# Patient Record
Sex: Male | Born: 1990 | Race: White | Hispanic: No | Marital: Married | Smoking: Former smoker
Health system: Southern US, Community
[De-identification: ages and names within clinical notes are randomized; demographics above are authoritative.]

## PROBLEM LIST (undated history)

## (undated) DIAGNOSIS — I493 Ventricular premature depolarization: Secondary | ICD-10-CM

## (undated) DIAGNOSIS — R55 Syncope and collapse: Secondary | ICD-10-CM

## (undated) DIAGNOSIS — K259 Gastric ulcer, unspecified as acute or chronic, without hemorrhage or perforation: Secondary | ICD-10-CM

## (undated) HISTORY — DX: Syncope and collapse: R55

## (undated) HISTORY — DX: Ventricular premature depolarization: I49.3

---

## 2014-11-10 ENCOUNTER — Emergency Department (HOSPITAL_BASED_OUTPATIENT_CLINIC_OR_DEPARTMENT_OTHER)
Admission: EM | Admit: 2014-11-10 | Discharge: 2014-11-10 | Disposition: A | Discharge status: Home / Self Care | Payer: Self-pay | Attending: Emergency Medicine | Admitting: Emergency Medicine

## 2014-11-10 ENCOUNTER — Emergency Department (HOSPITAL_BASED_OUTPATIENT_CLINIC_OR_DEPARTMENT_OTHER): Payer: Self-pay

## 2014-11-10 ENCOUNTER — Encounter (HOSPITAL_BASED_OUTPATIENT_CLINIC_OR_DEPARTMENT_OTHER): Payer: Self-pay | Admitting: *Deleted

## 2014-11-10 DIAGNOSIS — R109 Unspecified abdominal pain: Secondary | ICD-10-CM

## 2014-11-10 DIAGNOSIS — Z87891 Personal history of nicotine dependence: Secondary | ICD-10-CM | POA: Insufficient documentation

## 2014-11-10 DIAGNOSIS — Z880 Allergy status to penicillin: Secondary | ICD-10-CM | POA: Insufficient documentation

## 2014-11-10 DIAGNOSIS — R1084 Generalized abdominal pain: Secondary | ICD-10-CM | POA: Insufficient documentation

## 2014-11-10 DIAGNOSIS — Z8719 Personal history of other diseases of the digestive system: Secondary | ICD-10-CM | POA: Insufficient documentation

## 2014-11-10 HISTORY — DX: Gastric ulcer, unspecified as acute or chronic, without hemorrhage or perforation: K25.9

## 2014-11-10 LAB — URINALYSIS, ROUTINE W REFLEX MICROSCOPIC
Bilirubin Urine: NEGATIVE
GLUCOSE, UA: NEGATIVE mg/dL
HGB URINE DIPSTICK: NEGATIVE
KETONES UR: NEGATIVE mg/dL
Leukocytes, UA: NEGATIVE
Nitrite: NEGATIVE
PROTEIN: NEGATIVE mg/dL
Specific Gravity, Urine: 1.017 (ref 1.005–1.030)
UROBILINOGEN UA: 0.2 mg/dL (ref 0.0–1.0)
pH: 8 (ref 5.0–8.0)

## 2014-11-10 LAB — CBC WITH DIFFERENTIAL/PLATELET
BASOS PCT: 1 % (ref 0–1)
Basophils Absolute: 0 10*3/uL (ref 0.0–0.1)
EOS ABS: 0.3 10*3/uL (ref 0.0–0.7)
EOS PCT: 5 % (ref 0–5)
HCT: 47.4 % (ref 39.0–52.0)
HEMOGLOBIN: 16.4 g/dL (ref 13.0–17.0)
LYMPHS PCT: 35 % (ref 12–46)
Lymphs Abs: 2.1 10*3/uL (ref 0.7–4.0)
MCH: 31.4 pg (ref 26.0–34.0)
MCHC: 34.6 g/dL (ref 30.0–36.0)
MCV: 90.8 fL (ref 78.0–100.0)
MONO ABS: 0.4 10*3/uL (ref 0.1–1.0)
Monocytes Relative: 7 % (ref 3–12)
NEUTROS ABS: 3.2 10*3/uL (ref 1.7–7.7)
Neutrophils Relative %: 52 % (ref 43–77)
PLATELETS: 196 10*3/uL (ref 150–400)
RBC: 5.22 MIL/uL (ref 4.22–5.81)
RDW: 12.1 % (ref 11.5–15.5)
WBC: 6.1 10*3/uL (ref 4.0–10.5)

## 2014-11-10 LAB — COMPREHENSIVE METABOLIC PANEL
ALBUMIN: 4.4 g/dL (ref 3.5–5.0)
ALT: 26 U/L (ref 17–63)
AST: 19 U/L (ref 15–41)
Alkaline Phosphatase: 63 U/L (ref 38–126)
Anion gap: 9 (ref 5–15)
BUN: 10 mg/dL (ref 6–20)
CHLORIDE: 104 mmol/L (ref 101–111)
CO2: 27 mmol/L (ref 22–32)
Calcium: 9.4 mg/dL (ref 8.9–10.3)
Creatinine, Ser: 0.85 mg/dL (ref 0.61–1.24)
GFR calc Af Amer: >60 mL/min (ref >60)
GLUCOSE: 90 mg/dL (ref 65–99)
POTASSIUM: 4.1 mmol/L (ref 3.5–5.1)
SODIUM: 140 mmol/L (ref 135–145)
Total Bilirubin: 1.3 mg/dL — ABNORMAL HIGH (ref 0.3–1.2)
Total Protein: 7.1 g/dL (ref 6.5–8.1)

## 2014-11-10 LAB — LIPASE, BLOOD: LIPASE: 17 U/L — AB (ref 22–51)

## 2014-11-10 MED ORDER — POLYETHYLENE GLYCOL 3350 17 GM/SCOOP PO POWD: 17.0000 g | Freq: Two times a day (BID) | ORAL | Status: DC

## 2014-11-10 NOTE — ED Provider Notes (Signed)
CSN: [id number]     Arrival date & time 11/10/14  1124 History   First MD Initiated Contact with Patient 11/10/14 1226     Chief Complaint  Patient presents with  . Abdominal Pain     (Consider location/radiation/quality/duration/timing/severity/associated sxs/prior Treatment) HPI Jeffrey Ramos is a 24 y.o. male reports a history of gastric ulcers, comes in for evaluation of abdominal pain. Patient states he has not had a bowel movement in 9 days. This is not normal for him. He reports usually he will have a bowel movement every 2-4 days. He has tried milk of magnesia without relief of his symptoms. He reports constant abdominal pain in his lower abdomen now that he likens to "being punched". He denies any urge to defecate. Denies fevers, chills, nausea or vomiting, urinary symptoms, rectal pain, dark or bloody stools. Reports that he has a fairly bland diet and no longer eats spicy foods or red meats per the instructions of his doctor in [place].  Past Medical History  Diagnosis Date  . Gastric peptic ulcer    History reviewed. No pertinent past surgical history. No family history on file. Social History  Substance Use Topics  . Smoking status: Former Games developer  . Smokeless tobacco: None  . Alcohol Use: Yes     Comment: occassional     Review of Systems A 10 point review of systems was completed and was negative except for pertinent positives and negatives as mentioned in the history of present illness     Allergies  Penicillins  Home Medications   Prior to Admission medications   Medication Sig Start Date End Date Taking? Authorizing Provider  polyethylene glycol powder (GLYCOLAX/MIRALAX) powder Take 17 g by mouth 2 (two) times daily. Until daily soft stools  OTC 11/10/14   Joycie Peek, PA-C   BP 123/68 mmHg  Pulse 51  Temp(Src) 98.2 F (36.8 C) (Oral)  Resp 16  Ht 6\' 3"  (1.905 m)  Wt 200 lb (90.719 kg)  BMI 25.00 kg/m2  SpO2 100% Physical Exam  Constitutional: He  is oriented to person, place, and time. He appears well-developed and well-nourished.  HENT:  Head: Normocephalic and atraumatic.  Mouth/Throat: Oropharynx is clear and moist.  Eyes: Conjunctivae are normal. Pupils are equal, round, and reactive to light. Right eye exhibits no discharge. Left eye exhibits no discharge. No scleral icterus.  Neck: Neck supple.  Cardiovascular: Normal rate, regular rhythm and normal heart sounds.   Pulmonary/Chest: Effort normal and breath sounds normal. No respiratory distress. He has no wheezes. He has no rales.  Abdominal: Soft.  Tenderness diffusely throughout her lower abdomen. Abdomen remains soft, nondistended. No lesions or deformities or masses noted. Bowel sounds active  Musculoskeletal: He exhibits no tenderness.  Neurological: He is alert and oriented to person, place, and time.  Cranial Nerves II-XII grossly intact  Skin: Skin is warm and dry. No rash noted.  Psychiatric: He has a normal mood and affect.  Nursing note and vitals reviewed.   ED Course  Procedures (including critical care time) Labs Review Labs Reviewed  COMPREHENSIVE METABOLIC PANEL - Abnormal; Notable for the following:    Total Bilirubin 1.3 (*)    All other components within normal limits  LIPASE, BLOOD - Abnormal; Notable for the following:    Lipase 17 (*)    All other components within normal limits  CBC WITH DIFFERENTIAL/PLATELET  URINALYSIS, ROUTINE W REFLEX MICROSCOPIC (NOT AT [facility])    Imaging Review Dg Abd Acute W/chest  11/10/2014  CLINICAL DATA:  Abdominal pain and constipation.  EXAM: DG ABDOMEN ACUTE W/ 1V CHEST  COMPARISON:  None.  FINDINGS: Generous appearance of the heart with prominent medial right lung markings, suspect pectus excavatum, but cannot confirm in the frontal projection. There is no edema, consolidation, effusion, or pneumothorax.  Normal bowel gas pattern with no evidence of pneumatosis or pneumoperitoneum. No concerning intra-abdominal mass  effect or calcification. No abnormal stool retention.  IMPRESSION: Negative abdominal radiographs.  No acute cardiopulmonary disease.   Electronically Signed   By: Marnee Spring M.D.   On: 11/10/2014 13:38   I have personally reviewed and evaluated these images and lab results as part of my medical decision-making.   EKG Interpretation None     Meds given in ED:  Medications - No data to display  Discharge Medication List as of 11/10/2014  2:48 PM    START taking these medications   Details  polyethylene glycol powder (GLYCOLAX/MIRALAX) powder Take 17 g by mouth 2 (two) times daily. Until daily soft stools  OTC, Starting 11/10/2014, Until Discontinued, Print       Filed Vitals:   11/10/14 1141 11/10/14 1429  BP: 126/81 123/68  Pulse: 62 51  Temp: 98.2 F (36.8 C)   TempSrc: Oral   Resp: 16 16  Height:  (1.905 m)   Weight: 200 lb (90.719 kg)   SpO2: 100% 100%    MDM  Vitals stable - WNL -afebrile Pt resting comfortably in ED. PE--diffuse abdominal discomfort palpation in lower abdomen. No abdominal distention. No peritoneal signs or other evidence of surgical abdomen. Labwork-labs Baseline noncontributory. Imaging-plain films of abdomen and chest are negative. No obvious stool burden or other abnormal pathology.  DDX--patient here with abdominal discomfort likely secondary to constipation. Will DC with MiraLAX.  Low suspicion for appendicitis or other infectious process.  No evidence of other acute or emergent intra-abdominal pathology.  I discussed all relevant lab findings and imaging results with pt and they verbalized understanding. Discussed f/u with PCP within 48 hrs and return precautions, pt very amenable to plan. Prior to patient discharge, I discussed and reviewed this case with Dr.Belfi   Final diagnoses:  Abdominal discomfort       Joycie Peek, PA-C 11/10/14 1536  Rolan Bucco, MD 11/10/14 1546

## 2014-11-10 NOTE — Discharge Instructions (Signed)
You were evaluated in the ED today for abdominal discomfort. There does not appear to be an emergent cause for your symptoms at this time. Your exam, labs and x-rays are all reassuring. Please take your MiraLAX as directed to facilitate bowel movements. Follow-up with your doctors as needed. Return to ED for new or worsening symptoms.  Abdominal Pain Many things can cause abdominal pain. Usually, abdominal pain is not caused by a disease and will improve without treatment. It can often be observed and treated at home. Your health care provider will do a physical exam and possibly order blood tests and X-rays to help determine the seriousness of your pain. However, in many cases, more time must pass before a clear cause of the pain can be found. Before that point, your health care provider may not know if you need more testing or further treatment. HOME CARE INSTRUCTIONS  Monitor your abdominal pain for any changes. The following actions may help to alleviate any discomfort you are experiencing:  Only take over-the-counter or prescription medicines as directed by your health care provider.  Do not take laxatives unless directed to do so by your health care provider.  Try a clear liquid diet (broth, tea, or water) as directed by your health care provider. Slowly move to a bland diet as tolerated. SEEK MEDICAL CARE IF:  You have unexplained abdominal pain.  You have abdominal pain associated with nausea or diarrhea.  You have pain when you urinate or have a bowel movement.  You experience abdominal pain that wakes you in the night.  You have abdominal pain that is worsened or improved by eating food.  You have abdominal pain that is worsened with eating fatty foods.  You have a fever. SEEK IMMEDIATE MEDICAL CARE IF:   Your pain does not go away within 2 hours.  You keep throwing up (vomiting).  Your pain is felt only in portions of the abdomen, such as the right side or the left lower  portion of the abdomen.  You pass bloody or black tarry stools. MAKE SURE YOU:  Understand these instructions.   Will watch your condition.   Will get help right away if you are not doing well or get worse.  Document Released: 12/08/2004 Document Revised: 03/05/2013 Document Reviewed: 11/07/2012 [facility] Patient Information 2015 [place], [place]. This information is not intended to replace advice given to you by your health care provider. Make sure you discuss any questions you have with your health care provider.

## 2014-11-10 NOTE — ED Notes (Signed)
Abdominal pain. States he has not had a BM in 9 days. He took MOM with no relief but is having cramps.

## 2014-11-10 NOTE — ED Notes (Signed)
Patient transported to X-ray ambulatory with tech. 

## 2014-12-06 ENCOUNTER — Emergency Department (HOSPITAL_BASED_OUTPATIENT_CLINIC_OR_DEPARTMENT_OTHER)
Admission: EM | Admit: 2014-12-06 | Discharge: 2014-12-06 | Disposition: A | Discharge status: Home / Self Care | Payer: Self-pay | Attending: Emergency Medicine | Admitting: Emergency Medicine

## 2014-12-06 ENCOUNTER — Encounter (HOSPITAL_BASED_OUTPATIENT_CLINIC_OR_DEPARTMENT_OTHER): Payer: Self-pay | Admitting: *Deleted

## 2014-12-06 DIAGNOSIS — Z880 Allergy status to penicillin: Secondary | ICD-10-CM | POA: Insufficient documentation

## 2014-12-06 DIAGNOSIS — Z8711 Personal history of peptic ulcer disease: Secondary | ICD-10-CM | POA: Insufficient documentation

## 2014-12-06 DIAGNOSIS — Z87891 Personal history of nicotine dependence: Secondary | ICD-10-CM | POA: Insufficient documentation

## 2014-12-06 DIAGNOSIS — L03115 Cellulitis of right lower limb: Secondary | ICD-10-CM | POA: Insufficient documentation

## 2014-12-06 MED ORDER — CLINDAMYCIN HCL 300 MG PO CAPS: 300.0000 mg | ORAL_CAPSULE | Freq: Four times a day (QID) | ORAL | Status: DC

## 2014-12-06 MED ORDER — LIDOCAINE-EPINEPHRINE 1 %-1:100000 IJ SOLN
INTRAMUSCULAR | Status: AC
  Administered 2014-12-06: 11:00:00
  Filled 2014-12-06: qty 1

## 2014-12-06 NOTE — ED Notes (Signed)
Rt knee pain, states injured rt knee on nail, presents with increased pain at rt knee, has two red areas, that appeared slightly swollen. Having throbbing type pain, states having HA

## 2014-12-06 NOTE — Discharge Instructions (Signed)

## 2014-12-06 NOTE — ED Notes (Signed)
MD at bedside. 

## 2014-12-06 NOTE — ED Notes (Signed)
Areas marked with skin marker, pt states having yellow thick drainage from areas

## 2014-12-06 NOTE — ED Provider Notes (Signed)
CSN: [id number]     Arrival date & time 12/06/14  1012 History   First MD Initiated Contact with Patient 12/06/14 1032     Chief Complaint  Patient presents with  . Knee Pain     (Consider location/radiation/quality/duration/timing/severity/associated sxs/prior Treatment) Patient is a 24 y.o. male presenting with knee pain.  Knee Pain Location:  Knee Time since incident:  3 days Injury: no   Knee location:  R knee Pain details:    Quality:  Aching   Radiates to:  Does not radiate   Severity:  Moderate   Onset quality:  Gradual   Timing:  Constant   Progression:  Unchanged Chronicity:  New Prior injury to area:  No Relieved by:  Nothing Exacerbated by: pressure on sites. Ineffective treatments:  None tried Associated symptoms: no decreased ROM, no fever, no numbness, no stiffness and no swelling     Past Medical History  Diagnosis Date  . Gastric peptic ulcer    History reviewed. No pertinent past surgical history. History reviewed. No pertinent family history. Social History  Substance Use Topics  . Smoking status: Former Games developer  . Smokeless tobacco: None  . Alcohol Use: Yes     Comment: occassional     Review of Systems  Constitutional: Negative for fever.  Musculoskeletal: Negative for stiffness.  All other systems reviewed and are negative.     Allergies  Doxycycline and Penicillins  Home Medications   Prior to Admission medications   Medication Sig Start Date End Date Taking? Authorizing Provider  clindamycin (CLEOCIN) 300 MG capsule Take 1 capsule (300 mg total) by mouth 4 (four) times daily. X 7 days 12/06/14   Mirian Mo, MD  polyethylene glycol powder (GLYCOLAX/MIRALAX) powder Take 17 g by mouth 2 (two) times daily. Until daily soft stools  OTC 11/10/14   Joycie Peek, PA-C   BP 133/81 mmHg  Pulse 80  Temp(Src) 98.3 F (36.8 C) (Oral)  Resp 18  Ht  (1.905 m)  Wt 200 lb (90.719 kg)  BMI 25.00 kg/m2  SpO2 100% Physical Exam   Constitutional: He is oriented to person, place, and time. He appears well-developed and well-nourished.  HENT:  Head: Normocephalic and atraumatic.  Eyes: Conjunctivae and EOM are normal.  Neck: Normal range of motion. Neck supple.  Cardiovascular: Normal rate, regular rhythm and normal heart sounds.   Pulmonary/Chest: Effort normal and breath sounds normal. No respiratory distress.  Abdominal: He exhibits no distension. There is no tenderness. There is no rebound and no guarding.  Musculoskeletal: Normal range of motion.       Right knee: He exhibits normal range of motion, no swelling and no effusion. Tenderness (over skin) found.  Neurological: He is alert and oriented to person, place, and time.  Skin: Skin is warm and dry.  2 areas of erythema around inflamed hair follicles, R lateral supra and inferior patellar  Vitals reviewed.   ED Course  INCISION AND DRAINAGE Date/Time: 12/06/2014 11:26 AM Performed by: Mirian Mo Authorized by: Mirian Mo Consent: Verbal consent obtained. Type: abscess Body area: lower extremity (R knee) Anesthesia: local infiltration Local anesthetic: lidocaine 2% with epinephrine Scalpel size: 11 Incision type: single straight Incision depth: dermal Complexity: simple Drainage: purulent Drainage amount: scant Wound treatment: wound left open Patient tolerance: Patient tolerated the procedure well with no immediate complications Comments: X 2   (including critical care time) Labs Review Labs Reviewed - No data to display  Imaging Review No results found. I have  personally reviewed and evaluated these images and lab results as part of my medical decision-making.   EKG Interpretation None     ULTRASOUND LIMITED SOFT TISSUE/ MUSCULOSKELETAL: R knee Indication: cellulitis with ? fluctuance Linear probe used to evaluate area of interest in two planes. Findings:  1 cm fluid pocket over superior area of cellulitis Performed by: Dr  Littie Deeds Images saved electronically  MDM   Final diagnoses:  Cellulitis of right lower extremity    24 y.o. male with pertinent PMH of PUD presents with cellulitis of R knee.  FROM, no warmth on medial side of joint.  Incised ? Area of fluctuance and fluid via [place] with small amount of purulence.  DC home with clindamycin, close fu for wound check.    I have reviewed all laboratory and imaging studies if ordered as above  1. Cellulitis of right lower extremity         Mirian Mo, MD 12/06/14 [phone]

## 2015-02-10 ENCOUNTER — Encounter (HOSPITAL_BASED_OUTPATIENT_CLINIC_OR_DEPARTMENT_OTHER): Payer: Self-pay | Admitting: Emergency Medicine

## 2015-02-10 ENCOUNTER — Emergency Department (HOSPITAL_BASED_OUTPATIENT_CLINIC_OR_DEPARTMENT_OTHER)
Admission: EM | Admit: 2015-02-10 | Discharge: 2015-02-10 | Disposition: A | Discharge status: Home / Self Care | Payer: Self-pay | Attending: Emergency Medicine | Admitting: Emergency Medicine

## 2015-02-10 DIAGNOSIS — R5383 Other fatigue: Secondary | ICD-10-CM | POA: Insufficient documentation

## 2015-02-10 DIAGNOSIS — Z87891 Personal history of nicotine dependence: Secondary | ICD-10-CM | POA: Insufficient documentation

## 2015-02-10 DIAGNOSIS — J209 Acute bronchitis, unspecified: Secondary | ICD-10-CM | POA: Insufficient documentation

## 2015-02-10 DIAGNOSIS — Z880 Allergy status to penicillin: Secondary | ICD-10-CM | POA: Insufficient documentation

## 2015-02-10 DIAGNOSIS — Z8711 Personal history of peptic ulcer disease: Secondary | ICD-10-CM | POA: Insufficient documentation

## 2015-02-10 MED ORDER — AZITHROMYCIN 250 MG PO TABS: ORAL_TABLET | ORAL | Status: DC

## 2015-02-10 NOTE — ED Provider Notes (Signed)
CSN: [id number]     Arrival date & time 02/10/15  1606 History   First MD Initiated Contact with Patient 02/10/15 1613     Chief Complaint  Patient presents with  . Nasal Congestion     (Consider location/radiation/quality/duration/timing/severity/associated sxs/prior Treatment) Patient is a 24 y.o. male presenting with URI. The history is provided by the patient.  URI Presenting symptoms: congestion, cough, facial pain, fatigue, fever, rhinorrhea and sore throat   Severity:  Moderate Onset quality:  Sudden Duration:  2 weeks Timing:  Constant Progression:  Worsening Chronicity:  New Relieved by:  Nothing Worsened by:  Nothing tried Ineffective treatments:  None tried   Past Medical History  Diagnosis Date  . Gastric peptic ulcer    History reviewed. No pertinent past surgical history. History reviewed. No pertinent family history. Social History  Substance Use Topics  . Smoking status: Former Games developermoker  . Smokeless tobacco: None  . Alcohol Use: Yes     Comment: occassional     Review of Systems  Constitutional: Positive for fever and fatigue.  HENT: Positive for congestion, rhinorrhea and sore throat.   Respiratory: Positive for cough.   All other systems reviewed and are negative.     Allergies  Doxycycline and Penicillins  Home Medications   Prior to Admission medications   Medication Sig Start Date End Date Taking? Authorizing Provider  clindamycin (CLEOCIN) 300 MG capsule Take 1 capsule (300 mg total) by mouth 4 (four) times daily. X 7 days 12/06/14   Mirian MoMatthew Gentry, MD  polyethylene glycol powder (GLYCOLAX/MIRALAX) powder Take 17 g by mouth 2 (two) times daily. Until daily soft stools  OTC 11/10/14   Joycie PeekBenjamin Cartner, PA-C   BP 126/75 mmHg  Pulse 88  Temp(Src) 98.6 F (37 C) (Oral)  Resp 18  Ht 6\' 3"  (1.905 m)  Wt 220 lb (99.791 kg)  BMI 27.50 kg/m2  SpO2 100% Physical Exam  Constitutional: He is oriented to person, place, and time. He appears  well-developed and well-nourished. No distress.  HENT:  Head: Normocephalic and atraumatic.  Mouth/Throat: Oropharynx is clear and moist.  Neck: Normal range of motion. Neck supple.  Cardiovascular: Normal rate, regular rhythm and normal heart sounds.   No murmur heard. Pulmonary/Chest: Effort normal and breath sounds normal. No respiratory distress. He has no wheezes. He has no rales.  Abdominal: Soft. Bowel sounds are normal. He exhibits no distension. There is no tenderness.  Musculoskeletal: Normal range of motion. He exhibits no edema.  Lymphadenopathy:    He has no cervical adenopathy.  Neurological: He is alert and oriented to person, place, and time.  Skin: Skin is warm and dry. He is not diaphoretic.  Nursing note and vitals reviewed.   ED Course  Procedures (including critical care time) Labs Review Labs Reviewed - No data to display  Imaging Review No results found. I have personally reviewed and evaluated these images and lab results as part of my medical decision-making.   EKG Interpretation None      MDM   Final diagnoses:  None    Patient presents with a two-week history of persistent cough, nasal and chest congestion, and sore throat for 2 weeks. He has had no relief with over-the-counter medications. This is most likely a viral illness, however as it has not improved in 2 weeks despite over-the-counter medications, he will be treated with Zithromax for presumed bronchitis/sinusitis. He is to return as needed if symptoms worsen or change.    Geoffery Lyonsouglas Myquan Schaumburg, MD  02/10/15 1629 

## 2015-02-10 NOTE — Discharge Instructions (Signed)
Fill the prescription for Zithromax you have been provided with if your symptoms are not improving or worsen in the next 48 hours.  Return to the ER if symptoms significantly worsen or change.   Acute Bronchitis Bronchitis is inflammation of the airways that extend from the windpipe into the lungs (bronchi). The inflammation often causes mucus to develop. This leads to a cough, which is the most common symptom of bronchitis.  In acute bronchitis, the condition usually develops suddenly and goes away over time, usually in a couple weeks. Smoking, allergies, and asthma can make bronchitis worse. Repeated episodes of bronchitis may cause further lung problems.  CAUSES Acute bronchitis is most often caused by the same virus that causes a cold. The virus can spread from person to person (contagious) through coughing, sneezing, and touching contaminated objects. SIGNS AND SYMPTOMS   Cough.   Fever.   Coughing up mucus.   Body aches.   Chest congestion.   Chills.   Shortness of breath.   Sore throat.  DIAGNOSIS  Acute bronchitis is usually diagnosed through a physical exam. Your health care provider will also ask you questions about your medical history. Tests, such as chest X-rays, are sometimes done to rule out other conditions.  TREATMENT  Acute bronchitis usually goes away in a couple weeks. Oftentimes, no medical treatment is necessary. Medicines are sometimes given for relief of fever or cough. Antibiotic medicines are usually not needed but may be prescribed in certain situations. In some cases, an inhaler may be recommended to help reduce shortness of breath and control the cough. A cool mist vaporizer may also be used to help thin bronchial secretions and make it easier to clear the chest.  HOME CARE INSTRUCTIONS  Get plenty of rest.   Drink enough fluids to keep your urine clear or pale yellow (unless you have a medical condition that requires fluid restriction).  Increasing fluids may help thin your respiratory secretions (sputum) and reduce chest congestion, and it will prevent dehydration.   Take medicines only as directed by your health care provider.  If you were prescribed an antibiotic medicine, finish it all even if you start to feel better.  Avoid smoking and secondhand smoke. Exposure to cigarette smoke or irritating chemicals will make bronchitis worse. If you are a smoker, consider using nicotine gum or skin patches to help control withdrawal symptoms. Quitting smoking will help your lungs heal faster.   Reduce the chances of another bout of acute bronchitis by washing your hands frequently, avoiding people with cold symptoms, and trying not to touch your hands to your mouth, nose, or eyes.   Keep all follow-up visits as directed by your health care provider.  SEEK MEDICAL CARE IF: Your symptoms do not improve after 1 week of treatment.  SEEK IMMEDIATE MEDICAL CARE IF:  You develop an increased fever or chills.   You have chest pain.   You have severe shortness of breath.  You have bloody sputum.   You develop dehydration.  You faint or repeatedly feel like you are going to pass out.  You develop repeated vomiting.  You develop a severe headache. MAKE SURE YOU:   Understand these instructions.  Will watch your condition.  Will get help right away if you are not doing well or get worse.   This information is not intended to replace advice given to you by your health care provider. Make sure you discuss any questions you have with your health care provider.  Document Released: 04/07/2004 Document Revised: 03/21/2014 Document Reviewed: 08/21/2012 Elsevier Interactive Patient Education [employer].

## 2015-02-10 NOTE — ED Notes (Signed)
Patient states that x 2 weeks he is having cough and nasal congestion. Sore throat and Headache. The patient states that he is having an "itch to his chest"

## 2015-09-07 ENCOUNTER — Other Ambulatory Visit: Payer: Self-pay

## 2015-09-07 ENCOUNTER — Encounter (HOSPITAL_COMMUNITY): Payer: Self-pay | Admitting: Emergency Medicine

## 2015-09-07 ENCOUNTER — Emergency Department (HOSPITAL_COMMUNITY)
Admission: EM | Admit: 2015-09-07 | Discharge: 2015-09-08 | Disposition: A | Discharge status: Home / Self Care | Payer: BLUE CROSS/BLUE SHIELD | Attending: Emergency Medicine | Admitting: Emergency Medicine

## 2015-09-07 DIAGNOSIS — Z8719 Personal history of other diseases of the digestive system: Secondary | ICD-10-CM | POA: Insufficient documentation

## 2015-09-07 DIAGNOSIS — I493 Ventricular premature depolarization: Secondary | ICD-10-CM | POA: Diagnosis not present

## 2015-09-07 DIAGNOSIS — Z79899 Other long term (current) drug therapy: Secondary | ICD-10-CM | POA: Diagnosis not present

## 2015-09-07 DIAGNOSIS — R42 Dizziness and giddiness: Secondary | ICD-10-CM

## 2015-09-07 DIAGNOSIS — R410 Disorientation, unspecified: Secondary | ICD-10-CM | POA: Diagnosis not present

## 2015-09-07 DIAGNOSIS — Z87891 Personal history of nicotine dependence: Secondary | ICD-10-CM | POA: Diagnosis not present

## 2015-09-07 LAB — URINALYSIS, ROUTINE W REFLEX MICROSCOPIC
BILIRUBIN URINE: NEGATIVE
GLUCOSE, UA: NEGATIVE mg/dL
Hgb urine dipstick: NEGATIVE
Ketones, ur: NEGATIVE mg/dL
Leukocytes, UA: NEGATIVE
NITRITE: NEGATIVE
PH: 6 (ref 5.0–8.0)
Protein, ur: NEGATIVE mg/dL
SPECIFIC GRAVITY, URINE: 1.011 (ref 1.005–1.030)

## 2015-09-07 MED ORDER — SODIUM CHLORIDE 0.9 % IV BOLUS (SEPSIS)
1000.0000 mL | Freq: Once | INTRAVENOUS | Status: AC
  Administered 2015-09-07: 1000 mL via INTRAVENOUS

## 2015-09-07 NOTE — ED Notes (Signed)
EKG given to MD Scnetxni

## 2015-09-07 NOTE — ED Notes (Signed)
Orthostatic vital signs clicked off in error

## 2015-09-07 NOTE — ED Notes (Signed)
Bed: [id number] Expected date:  Expected time:  Means of arrival:  Comments: Dehydration, tachy

## 2015-09-07 NOTE — ED Provider Notes (Signed)
CSN: [id number]     Arrival date & time 09/07/15  2237 History   First MD Initiated Contact with Patient 09/07/15 2304     Chief Complaint  Patient presents with  . 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 He continued exerting himself tonight and had good fluid intake. 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 He does not member these conversations. He did not have full syncope but was obviously not acting normally. A medic on scene performed a 12-lead EKG which showed frequent PVCs. He was given 500 mL normal saline bolus which made him feel somewhat better. Also given zofran. Patient does not currently complain of any pain. He had some mild calf cramping prior to arrival and fluid administration. No history of arrhythmias or other heart problems in himself or family members. He does have upcoming doctor's appointment for evaluation of "dizzy spells" that he has been having intermittently for the past several months. He states that eating helps improve these symptoms. No chest pain or shortness of breath with activity. Onset of symptoms acute. Course is improving. Nothing makes symptoms better or worse.  Patient is a 25 y.o. male presenting with dizziness. The history is provided by the patient.  Dizziness Associated symptoms: no chest pain, no diarrhea, no headaches, no nausea and no vomiting     Past Medical History  Diagnosis Date  . Gastric peptic ulcer    History reviewed. No pertinent past surgical history. History reviewed. No pertinent family history. Social History  Substance Use Topics  . Smoking  status: Former Games developermoker  . Smokeless tobacco: None  . Alcohol Use: Yes     Comment: occassional     Review of Systems  Constitutional: Negative for fever.  HENT: Negative for rhinorrhea and sore throat.   Eyes: Negative for redness.  Respiratory: Negative for cough.   Cardiovascular: Negative for chest pain.  Gastrointestinal: Negative for nausea, vomiting, abdominal pain and diarrhea.  Genitourinary: Negative for dysuria.  Musculoskeletal: Negative for myalgias.  Skin: Negative for rash.  Neurological: Positive for dizziness. Negative for headaches.  Psychiatric/Behavioral: Positive for confusion.    Allergies  Doxycycline and Penicillins  Home Medications   Prior to Admission medications   Medication Sig Start Date End Date Taking? Authorizing Provider  PROAIR HFA 108 [phone](90 Base) MCG/ACT inhaler Inhale 2 puffs into the lungs every 6 (six) hours as needed for wheezing or shortness of breath.  06/02/15  Yes Historical Provider, MD   BP 128/79 mmHg  Pulse 98  Temp(Src) 98.4 F (36.9 C) (Oral)  Resp 16  SpO2 95%   Physical Exam  Constitutional: He appears well-developed and well-nourished.  HENT:  Head: Normocephalic and atraumatic.  Eyes: Conjunctivae are normal. Right eye exhibits no discharge. Left eye exhibits no discharge.  Neck: Normal range of motion. Neck supple.  Cardiovascular: Normal rate and normal heart sounds.  An irregular rhythm present.  No murmur heard. Occasional PVCs noted.   Pulmonary/Chest: Effort normal and breath sounds normal. No respiratory distress. He has no wheezes. He has no rales.  Abdominal: Soft. There is no tenderness.  Neurological: He is alert.  Skin: Skin is warm and dry.  Psychiatric: He has a  normal mood and affect.  Nursing note and vitals reviewed.   ED Course  Procedures (including critical care time) Labs Review Labs Reviewed  BASIC METABOLIC PANEL  URINALYSIS, ROUTINE W REFLEX MICROSCOPIC ([facility])  CBC WITH  DIFFERENTIAL/PLATELET  CBC WITH DIFFERENTIAL/PLATELET     EKG Interpretation   Date/Time:  Monday September 07 2015 23:51:36 EDT Ventricular Rate:  80 PR Interval:    QRS Duration: 84 QT Interval:  359 QTC Calculation: 415 R Axis:   49 Text Interpretation:  Sinus arrhythmia Left atrial enlargement  Interpretation limited secondary to artifact No old tracing to compare  Confirmed by Erroll Lunani, Adeleke Ayokunle [id number]) on 09/07/2015 11:56:11 PM       11:32 PM Patient seen and examined. Work-up initiated. Medications ordered.   Vital signs reviewed and are as follows: BP 128/79 mmHg  Pulse 98  Temp(Src) 98.4 F (36.9 C) (Oral)  Resp 16  SpO2 95%  11:56 PM EKG reviewed with Dr. Mora Bellmanni.   1:11 AM patient stable, feeling well. Discharge to home at this time. Patient has PCP appointment tomorrow for dizzy spells that he has been having.  Encouraged patient to rest, continue good oral intake. Return to the emergency department with chest pain, syncope, shortness breath, or other concerns. He verbalizes understanding and agrees with plan.   MDM   Final diagnoses:  Lightheadedness  Premature ventricular contractions (PVCs) (VPCs)   Lightheadedness, near syncope: Blood sugar normal. Labs are reassuring. EKG is reassuring. There are inferior ST segment changes felt secondary to early repolarization. No old for comparison. Patient does not have chest pain, shortness of breath to suggest ischemia. No concerning family history. No signs of Brugada syndrome, prolonged QTC, WPW, ventricular hypertrophy, or other concerning findings. He does have occasional premature ventricular contractions. Normal electrolytes. Do not feel patient is significantly dehydrated. Patient to follow up closely with his primary care physician.   No dangerous or life-threatening conditions suspected or identified by history, physical exam, and by work-up. No indications for hospitalization identified.    Renne CriglerJoshua Salih Williamson,  PA-C 09/08/15 0115  Raeford RazorStephen Kohut, MD 09/08/15 1038

## 2015-09-07 NOTE — ED Notes (Signed)
Pt was working strenuously at the [employer] tonight and became dizzy, weak.  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  No LOC.  BP 120s/70s CBG 93 98% O2 on RA.

## 2015-09-07 NOTE — ED Notes (Signed)
PA at bedside.

## 2015-09-08 LAB — CBC WITH DIFFERENTIAL/PLATELET
BASOS PCT: 0 %
Basophils Absolute: 0 10*3/uL (ref 0.0–0.1)
EOS ABS: 0.2 10*3/uL (ref 0.0–0.7)
Eosinophils Relative: 2 %
HCT: 40 % (ref 39.0–52.0)
HEMOGLOBIN: 14 g/dL (ref 13.0–17.0)
LYMPHS ABS: 2.9 10*3/uL (ref 0.7–4.0)
Lymphocytes Relative: 28 %
MCH: 30.7 pg (ref 26.0–34.0)
MCHC: 35 g/dL (ref 30.0–36.0)
MCV: 87.7 fL (ref 78.0–100.0)
Monocytes Absolute: 0.6 10*3/uL (ref 0.1–1.0)
Monocytes Relative: 6 %
NEUTROS ABS: 6.6 10*3/uL (ref 1.7–7.7)
Neutrophils Relative %: 64 %
Platelets: 190 10*3/uL (ref 150–400)
RBC: 4.56 MIL/uL (ref 4.22–5.81)
RDW: 12.3 % (ref 11.5–15.5)
WBC: 10.3 10*3/uL (ref 4.0–10.5)

## 2015-09-08 LAB — BASIC METABOLIC PANEL
ANION GAP: 8 (ref 5–15)
BUN: 16 mg/dL (ref 6–20)
CHLORIDE: 106 mmol/L (ref 101–111)
CO2: 25 mmol/L (ref 22–32)
Calcium: 8.9 mg/dL (ref 8.9–10.3)
Creatinine, Ser: 0.94 mg/dL (ref 0.61–1.24)
GFR calc Af Amer: >60 mL/min (ref >60)
GLUCOSE: 93 mg/dL (ref 65–99)
POTASSIUM: 3.7 mmol/L (ref 3.5–5.1)
Sodium: 139 mmol/L (ref 135–145)

## 2015-09-08 NOTE — Discharge Instructions (Signed)
Please read and follow all provided instructions.  Your diagnoses today include:  1. Lightheadedness   2. Premature ventricular contractions (PVCs) (VPCs)     Tests performed today include:  Blood counts and electrolytes, urine test -- all normal  EKG - no serious problems  Vital signs. See below for your results today.   Medications prescribed:   None  Take any prescribed medications only as directed.  Home care instructions:  Follow any educational materials contained in this packet.  BE VERY CAREFUL not to take multiple medicines containing Tylenol (also called acetaminophen). Doing so can lead to an overdose which can damage your liver and cause liver failure and possibly death.   Follow-up instructions: Please follow-up with your primary care provider tomorrow as planned for further evaluation of your symptoms.   Return instructions:   Please return to the Emergency Department if you experience worsening symptoms.   Return for chest pain, shortness of breath, or if you pass out.  Please return if you have any other emergent concerns.  Additional Information:  Your vital signs today were: BP 121/86 mmHg   Pulse 85   Temp(Src) 98.4 F (36.9 C) (Oral)   Resp 20   SpO2 99% If your blood pressure (BP) was elevated above 135/85 this visit, please have this repeated by your doctor within one month. --------------

## 2015-09-11 ENCOUNTER — Emergency Department (HOSPITAL_BASED_OUTPATIENT_CLINIC_OR_DEPARTMENT_OTHER): Payer: BLUE CROSS/BLUE SHIELD

## 2015-09-11 ENCOUNTER — Encounter (HOSPITAL_BASED_OUTPATIENT_CLINIC_OR_DEPARTMENT_OTHER): Payer: Self-pay | Admitting: *Deleted

## 2015-09-11 ENCOUNTER — Emergency Department (HOSPITAL_BASED_OUTPATIENT_CLINIC_OR_DEPARTMENT_OTHER)
Admission: EM | Admit: 2015-09-11 | Discharge: 2015-09-11 | Disposition: A | Discharge status: Home / Self Care | Payer: BLUE CROSS/BLUE SHIELD | Attending: Emergency Medicine | Admitting: Emergency Medicine

## 2015-09-11 DIAGNOSIS — Y999 Unspecified external cause status: Secondary | ICD-10-CM | POA: Diagnosis not present

## 2015-09-11 DIAGNOSIS — W11XXXA Fall on and from ladder, initial encounter: Secondary | ICD-10-CM | POA: Diagnosis not present

## 2015-09-11 DIAGNOSIS — Y939 Activity, unspecified: Secondary | ICD-10-CM | POA: Insufficient documentation

## 2015-09-11 DIAGNOSIS — S8012XA Contusion of left lower leg, initial encounter: Secondary | ICD-10-CM | POA: Diagnosis not present

## 2015-09-11 DIAGNOSIS — Y929 Unspecified place or not applicable: Secondary | ICD-10-CM | POA: Insufficient documentation

## 2015-09-11 DIAGNOSIS — Z87891 Personal history of nicotine dependence: Secondary | ICD-10-CM | POA: Diagnosis not present

## 2015-09-11 DIAGNOSIS — S8992XA Unspecified injury of left lower leg, initial encounter: Secondary | ICD-10-CM | POA: Diagnosis present

## 2015-09-11 MED ORDER — NAPROXEN 500 MG PO TABS: 500.0000 mg | ORAL_TABLET | Freq: Two times a day (BID) | ORAL | Status: DC | PRN

## 2015-09-11 MED ORDER — CYCLOBENZAPRINE HCL 10 MG PO TABS: 10.0000 mg | ORAL_TABLET | Freq: Two times a day (BID) | ORAL | Status: DC | PRN

## 2015-09-11 NOTE — ED Notes (Signed)
He slipped while on a ladder and hit his left lower leg on a trailer hitch. Swelling below the knee. Abrasion noted.

## 2015-09-11 NOTE — ED Notes (Signed)
Pt reports slipping down 3 ladder rungs and hitting left shin on a trailer hitch. Pt has swelling noted to L leg. Pt denies LOC.

## 2015-09-11 NOTE — ED Provider Notes (Signed)
CSN: [id number]     Arrival date & time 09/11/15  1341 History   First MD Initiated Contact with Patient 09/11/15 1410     Chief Complaint  Patient presents with  . Leg Injury     (Consider location/radiation/quality/duration/timing/severity/associated sxs/prior Treatment) HPI   25 year old male presenting for evaluation of left leg injury. Patient reported approximately 2 hours ago he accidentally slipped on a standing ladder striking his left lower leg against lateral rung and fell down 3 rung distance, landed on his buttock. He denies striking his head of consciousness. Report acute onset of sharp throbbing pain to his left upper shin from impact. Pain initially was 7 out of 10 and now improved to 3 out of 10. He was able to ambulate afterward. Denies any ankle or knee or hip pain. He denies any precipitating symptoms prior to the fall. No specific treatment tried. Denies any numbness social with injury. He is up-to-date with tetanus. No complaint of low back or buttock pain. This incident happened at home.  Past Medical History  Diagnosis Date  . Gastric peptic ulcer    History reviewed. No pertinent past surgical history. No family history on file. Social History  Substance Use Topics  . Smoking status: Former Games developermoker  . Smokeless tobacco: None  . Alcohol Use: Yes     Comment: occassional     Review of Systems  Constitutional: Negative for fever.  Cardiovascular: Negative for chest pain.  Gastrointestinal: Negative for abdominal pain.  Musculoskeletal: Negative for back pain.  Skin: Positive for wound.  Neurological: Negative for numbness.      Allergies  Doxycycline and Penicillins  Home Medications   Prior to Admission medications   Medication Sig Start Date End Date Taking? Authorizing Provider  PROAIR HFA 108 [phone](90 Base) MCG/ACT inhaler Inhale 2 puffs into the lungs every 6 (six) hours as needed for wheezing or shortness of breath.  06/02/15   Historical Provider,  MD   BP 116/80 mmHg  Pulse 88  Temp(Src) 98.7 F (37.1 C) (Oral)  Resp 18  Ht 6\' 3"  (1.905 m)  Wt 95.255 kg  BMI 26.25 kg/m2  SpO2 100% Physical Exam  Constitutional: He appears well-developed and well-nourished. No distress.  HENT:  Head: Atraumatic.  Eyes: Conjunctivae are normal.  Neck: Neck supple.  Musculoskeletal: He exhibits tenderness (Left lower leg: Tenderness noted to the anterior shin at the tibial tuberosity with some mild swelling noted. Linear skin abrasion without any deep laceration noted. No crepitus.).  Left hip, knee, and ankle with full range of motion and nontender to palpation. dorsalis pedis pulse palpable with brisk cap refills.  Neurological: He is alert.  Skin: No rash noted.  Psychiatric: He has a normal mood and affect.  Nursing note and vitals reviewed.   ED Course  Procedures (including critical care time) Labs Review Labs Reviewed - No data to display  Imaging Review Dg Tibia/fibula Left  09/11/2015  CLINICAL DATA:  Fall from ladder. Trauma 2 proximal anterior left tibia. Pain, redness, and swelling. EXAM: LEFT TIBIA AND FIBULA - 2 VIEW COMPARISON:  None. FINDINGS: There is no evidence of fracture or other focal bone lesions. Soft tissues are unremarkable. IMPRESSION: Negative two views of the left tibia and fibula. Electronically Signed   By: Marin Robertshristopher  Mattern M.D.   On: 09/11/2015 14:24   I have personally reviewed and evaluated these images and lab results as part of my medical decision-making.   EKG Interpretation None  MDM   Final diagnoses:  Contusion, lower leg, left, initial encounter    BP 116/80 mmHg  Pulse 88  Temp(Src) 98.7 F (37.1 C) (Oral)  Resp 18  Ht 6\' 3"  (1.905 m)  Wt 95.255 kg  BMI 26.25 kg/m2  SpO2 100%   3:10 PM Patient had a mechanical fall injuring his left anterior shin. X-ray shows no acute fractures or dislocation. Patient is able to ambulate without difficulty. He is neurovascularly intact.  Mild abrasion without any deep laceration. Pain medication offer, patient declined. Rice therapy discussed. He is up-to-date with tetanus.  Fayrene HelperBowie Clairessa Boulet, PA-C 09/11/15 1511  Laurence Spatesachel Morgan Little, MD 09/11/15 [phone]

## 2015-09-11 NOTE — Discharge Instructions (Signed)

## 2015-09-12 DIAGNOSIS — R55 Syncope and collapse: Secondary | ICD-10-CM

## 2015-09-12 HISTORY — DX: Syncope and collapse: R55

## 2016-09-30 IMAGING — CR DG ABDOMEN ACUTE W/ 1V CHEST
4 series · 4 of 4 positions shown · non-contrast
Comparison: None.

CLINICAL DATA: Abdominal pain and constipation.

EXAM:
DG ABDOMEN ACUTE W/ 1V CHEST

[w chest pa]
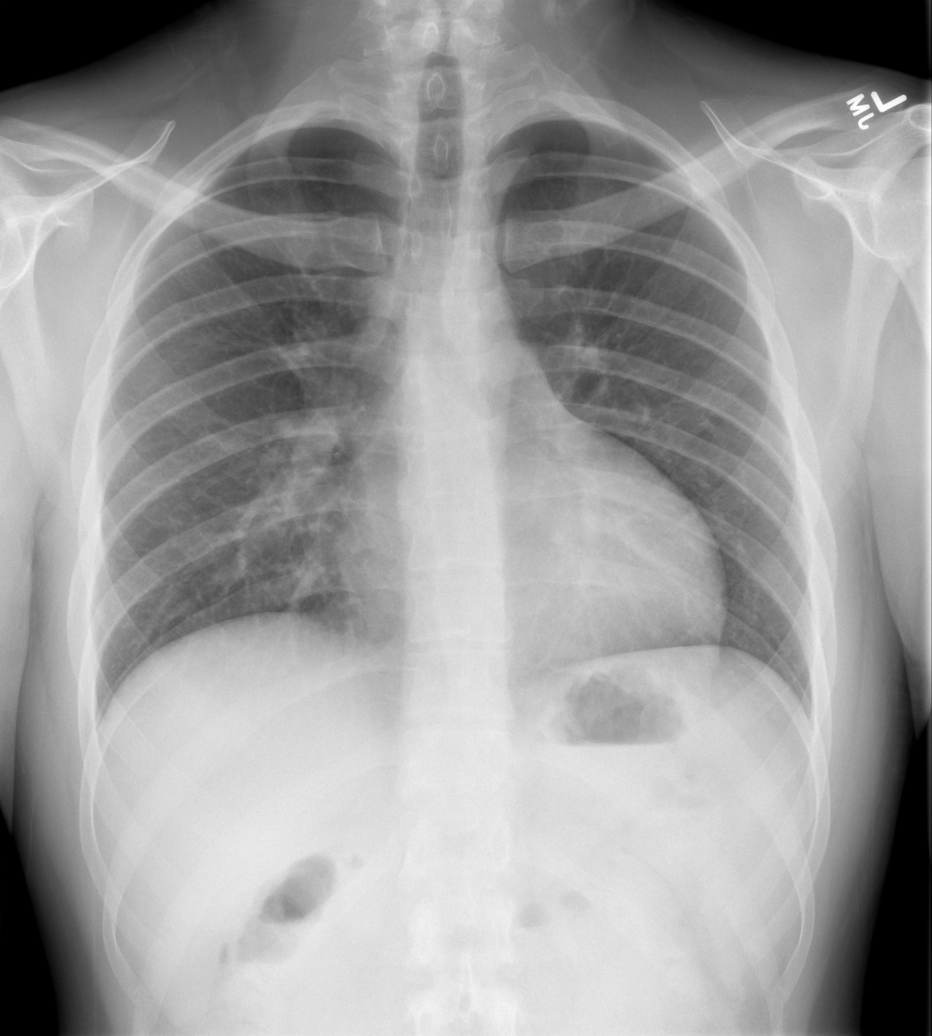

[w abdomen upright]
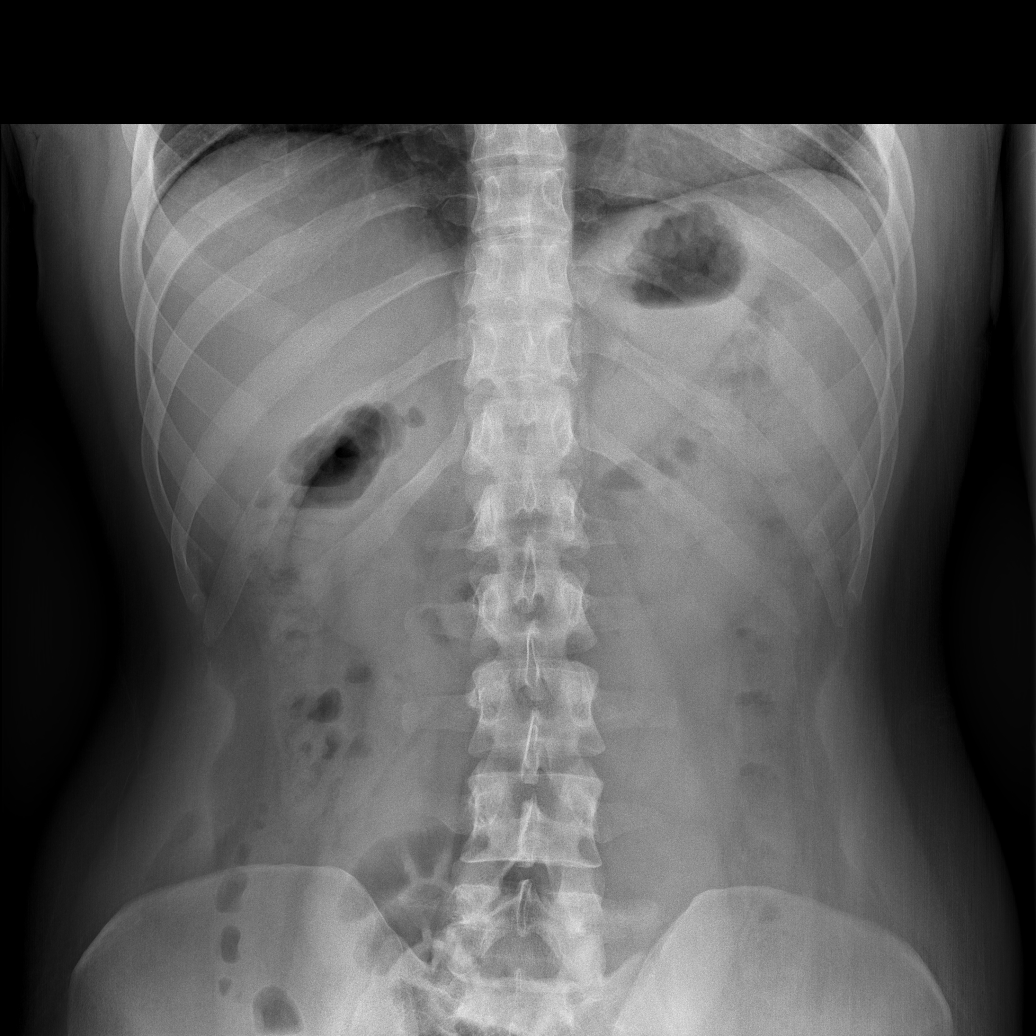

[t abdomen supine (1 of 2)]
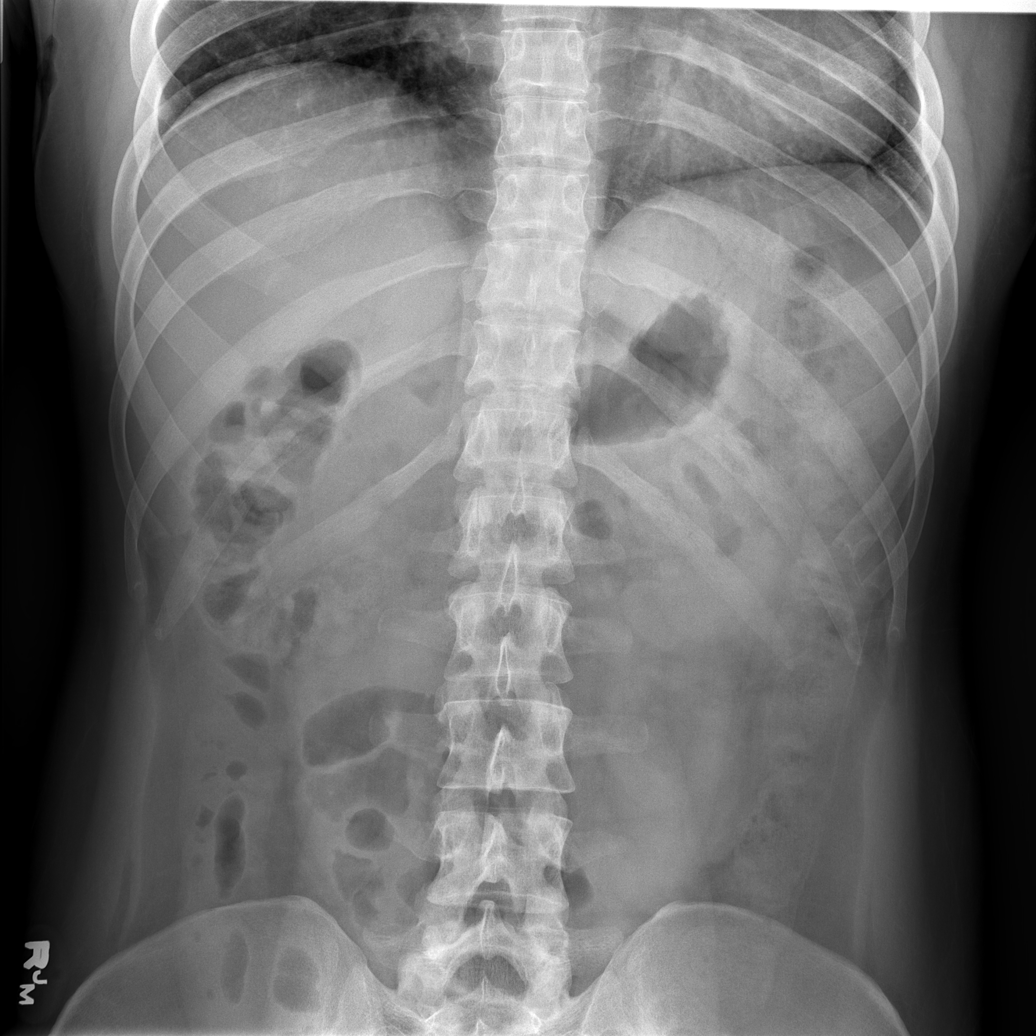

[t abdomen supine (2 of 2)]
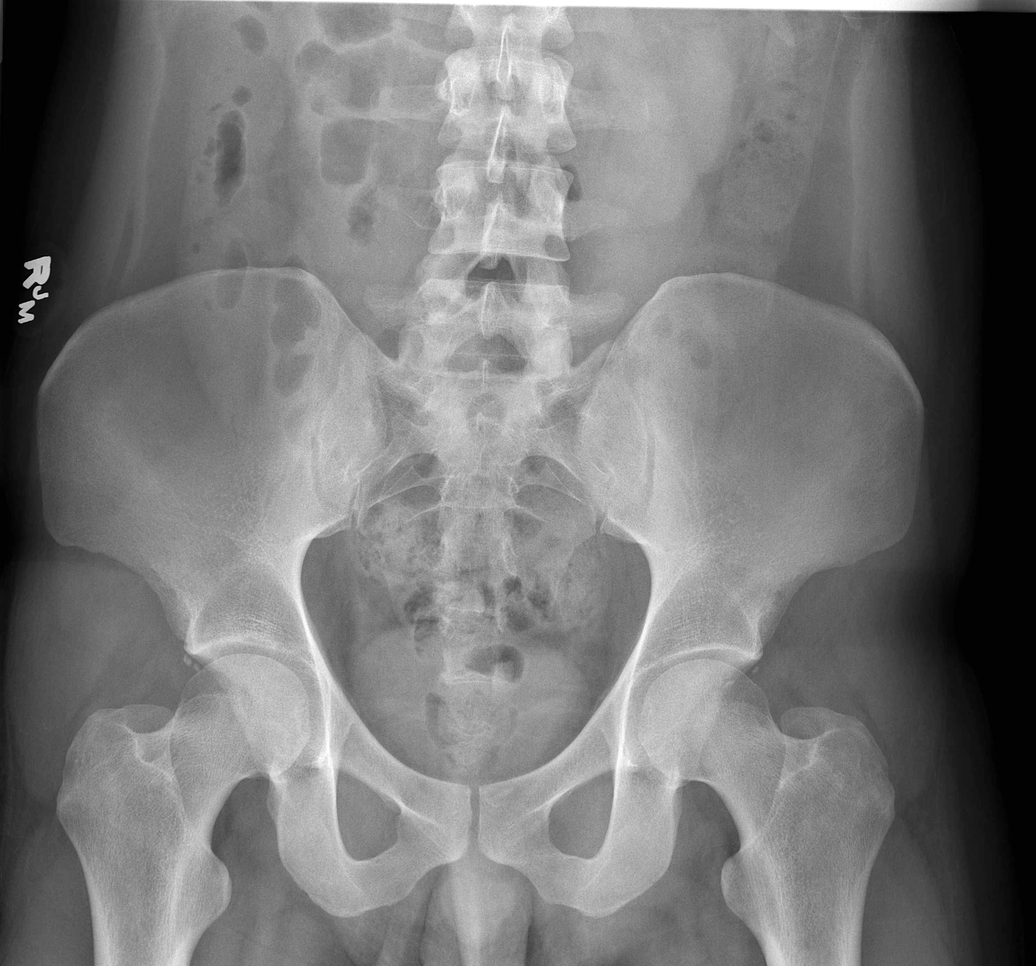

[4 of 4 positions shown; findings below may reference images not displayed]

FINDINGS: Generous appearance of the heart with prominent medial right lung
markings, suspect pectus excavatum, but cannot confirm in the
frontal projection. There is no edema, consolidation, effusion, or
pneumothorax.

Normal bowel gas pattern with no evidence of pneumatosis or
pneumoperitoneum. No concerning intra-abdominal mass effect or
calcification. No abnormal stool retention.
IMPRESSION: Negative abdominal radiographs.  No acute cardiopulmonary disease.

## 2017-05-13 ENCOUNTER — Emergency Department (HOSPITAL_COMMUNITY)
Admission: EM | Admit: 2017-05-13 | Discharge: 2017-05-13 | Disposition: A | Discharge status: Home / Self Care | Payer: Self-pay | Attending: Emergency Medicine | Admitting: Emergency Medicine

## 2017-05-13 ENCOUNTER — Emergency Department (HOSPITAL_COMMUNITY): Payer: Self-pay

## 2017-05-13 ENCOUNTER — Encounter (HOSPITAL_COMMUNITY): Payer: Self-pay

## 2017-05-13 DIAGNOSIS — R079 Chest pain, unspecified: Secondary | ICD-10-CM | POA: Insufficient documentation

## 2017-05-13 DIAGNOSIS — I493 Ventricular premature depolarization: Secondary | ICD-10-CM | POA: Insufficient documentation

## 2017-05-13 DIAGNOSIS — Z87891 Personal history of nicotine dependence: Secondary | ICD-10-CM | POA: Insufficient documentation

## 2017-05-13 DIAGNOSIS — Z79899 Other long term (current) drug therapy: Secondary | ICD-10-CM | POA: Insufficient documentation

## 2017-05-13 LAB — CBC WITH DIFFERENTIAL/PLATELET
BASOS ABS: 0 10*3/uL (ref 0.0–0.1)
Basophils Relative: 0 %
EOS PCT: 3 %
Eosinophils Absolute: 0.2 10*3/uL (ref 0.0–0.7)
HEMATOCRIT: 43.7 % (ref 39.0–52.0)
HEMOGLOBIN: 14.9 g/dL (ref 13.0–17.0)
LYMPHS ABS: 1.8 10*3/uL (ref 0.7–4.0)
Lymphocytes Relative: 23 %
MCH: 30.8 pg (ref 26.0–34.0)
MCHC: 34.1 g/dL (ref 30.0–36.0)
MCV: 90.3 fL (ref 78.0–100.0)
Monocytes Absolute: 0.3 10*3/uL (ref 0.1–1.0)
Monocytes Relative: 4 %
NEUTROS ABS: 5.5 10*3/uL (ref 1.7–7.7)
NEUTROS PCT: 70 %
PLATELETS: 205 10*3/uL (ref 150–400)
RBC: 4.84 MIL/uL (ref 4.22–5.81)
RDW: 12.9 % (ref 11.5–15.5)
WBC: 7.8 10*3/uL (ref 4.0–10.5)

## 2017-05-13 LAB — BASIC METABOLIC PANEL
ANION GAP: 9 (ref 5–15)
BUN: 9 mg/dL (ref 6–20)
CHLORIDE: 104 mmol/L (ref 101–111)
CO2: 25 mmol/L (ref 22–32)
Calcium: 9.3 mg/dL (ref 8.9–10.3)
Creatinine, Ser: 0.92 mg/dL (ref 0.61–1.24)
GFR calc Af Amer: >60 mL/min (ref >60)
Glucose, Bld: 88 mg/dL (ref 65–99)
Potassium: 3.8 mmol/L (ref 3.5–5.1)
SODIUM: 138 mmol/L (ref 135–145)

## 2017-05-13 LAB — I-STAT TROPONIN, ED: Troponin i, poc: 0 ng/mL (ref 0.00–0.08)

## 2017-05-13 MED ORDER — SODIUM CHLORIDE 0.9 % IV BOLUS (SEPSIS): 1000.0000 mL | Freq: Once | INTRAVENOUS | Status: DC

## 2017-05-13 NOTE — ED Provider Notes (Signed)
[facility] EMERGENCY DEPARTMENT Provider Note   CSN: [id number] Arrival date & time: 05/13/17  1607     History   Chief Complaint No chief complaint on file.   HPI Josten Warmuth is a 27 y.o. male.  The history is provided by the patient and medical records. No language interpreter was used.   Robey Massmann is a 27 y.o. male who presents to the Emergency Department complaining of chest pain. Patient states that he was at work, got out of his car. He took a few steps then developed a left-sided, nonradiating chest pain which she describes as a pressure.  Associated with nausea and dizziness.  He then sat on the ground.  He reports coworkers telling him he was "going in and out of consciousness", however he remembers the entire incident and denies any loss of consciousness.  He reports a similar episode about a year ago.  He saw cardiology and had a reassuring echo and stress test at that time.  Stress test from 09/29/2015 reviewed showing no ischemic EKG changes with exercise and very good exercise tolerance.  Normal blood pressure response with exercise.  He did have chest pain and PVCs during recovery.  This was all deemed very reassuring.  He reports having no episodes of the last year, however today seems quite similar.  He reports that 2017 episodes were much more severe as he was actually having syncopal episodes, today he did not.  He was given 324 ASA by EMS prior to arrival.  He currently is chest pain free as well as symptom-free.  He feels much better currently without complaints.  He denies any shortness of breath.  He has not had any fever or chills.  Past Medical History:  Diagnosis Date  . Gastric peptic ulcer     There are no active problems to display for this patient.   History reviewed. No pertinent surgical history.     Home Medications    Prior to Admission medications   Medication Sig Start Date End Date Taking? Authorizing Provider    cyclobenzaprine (FLEXERIL) 10 MG tablet Take 1 tablet (10 mg total) by mouth 2 (two) times daily as needed for muscle spasms. 09/11/15   Fayrene Helper, PA-C  naproxen (NAPROSYN) 500 MG tablet Take 1 tablet (500 mg total) by mouth 2 (two) times daily between meals as needed for moderate pain. 09/11/15   Fayrene Helper, PA-C  PROAIR HFA 108 [phone] Base) MCG/ACT inhaler Inhale 2 puffs into the lungs every 6 (six) hours as needed for wheezing or shortness of breath.  06/02/15   [provider]    Family History History reviewed. No pertinent family history.  Social History Social History   Tobacco Use  . Smoking status: Former Games developer  . Smokeless tobacco: Never Used  Substance Use Topics  . Alcohol use: Yes    Comment: occassional   . Drug use: No     Allergies   Doxycycline and Penicillins   Review of Systems Review of Systems  Cardiovascular: Positive for chest pain. Negative for palpitations and leg swelling.  Gastrointestinal: Positive for nausea.  Neurological: Positive for light-headedness.  All other systems reviewed and are negative.    Physical Exam Updated Vital Signs BP (!) 134/91   Pulse 70   Temp 98.9 F (37.2 C) (Oral)   Resp 18   SpO2 99%   Physical Exam  Constitutional: He is oriented to person, place, and time. He appears well-developed and well-nourished. No  distress.  HENT:  Head: Normocephalic and atraumatic.  Neck: Neck supple.  Cardiovascular: Normal rate, regular rhythm and normal heart sounds.  No murmur heard. Pulmonary/Chest: Effort normal and breath sounds normal. No respiratory distress. He has no wheezes. He has no rales. He exhibits no tenderness.  Abdominal: Soft. He exhibits no distension. There is no tenderness.  Musculoskeletal: He exhibits no edema.  Neurological: He is alert and oriented to person, place, and time.  Skin: Skin is warm and dry.  Nursing note and vitals reviewed.     ED Treatments / Results  Labs (all labs  ordered are listed, but only abnormal results are displayed) Labs Reviewed  BASIC METABOLIC PANEL  CBC WITH DIFFERENTIAL/PLATELET  I-STAT TROPONIN, ED    EKG  EKG Interpretation  Date/Time:  Saturday May 13 2017 16:17:01 EST Ventricular Rate:  64 PR Interval:    QRS Duration: 104 QT Interval:  405 QTC Calculation: 418 R Axis:   75 Text Interpretation:  Sinus rhythm since last tracing no significant change Confirmed by Rolan Bucco [id number] on 05/13/2017 4:20:59 PM       Radiology Dg Chest 2 View  Result Date: 05/13/2017 CLINICAL DATA:  Chest pain. EXAM: CHEST  2 VIEW COMPARISON:  None. FINDINGS: The heart size and mediastinal contours are within normal limits. Both lungs are clear. No pneumothorax or pleural effusion is noted. The visualized skeletal structures are unremarkable. IMPRESSION: No active cardiopulmonary disease. Electronically Signed   By: Lupita Raider, M.D.   On: 05/13/2017 17:10    Procedures Procedures (including critical care time)  Medications Ordered in ED Medications - No data to display   Initial Impression / Assessment and Plan / ED Course  I have reviewed the triage vital signs and the nursing notes.  Pertinent labs & imaging results that were available during my care of the patient were reviewed by me and considered in my medical decision making (see chart for details).    Quanell Loughney is a 27 y.o. male who presents to ED for near-syncopal episode. Hx of similar in the past, but reports today, symptoms were much less severe. Chart extensively reviewed.  Was seen by cardiology in 2017 for chest pain syncopal episode.  He underwent echo and stress test at that time. Stress test from 09/29/2015 reviewed showing no ischemic EKG changes with exercise and very good exercise tolerance.  Normal blood pressure response with exercise.  He did have chest pain and PVCs during recovery.  This was all deemed very reassuring. He unfortunately has not followed up  with cardiology since this due to financial reasons. On exam today, patient is afebrile, hemodynamically stable with normal cardiopulmonary examination.  EMS EKG does show a few intermittent PVCs.  EKG in ER today sinus rhythm with no acute changes from previous.  Patient will intermittently have PVC on cardiac monitoring, however is not symptomatic from this in place.  Orthostatic vital signs done and he showed no symptoms during this as well.  He is tolerating p.o. and ambulatory in ED without difficulty.  Chest x-ray negative.  Lab work reassuring including normal troponin.  Reasons to return to the ER discussed with patient and significant other at length.  Recommended cardiology follow-up.  All questions answered.   Final Clinical Impressions(s) / ED Diagnoses   Final diagnoses:  Chest pain  PVC's (premature ventricular contractions)    ED Discharge Orders    None       Ward, Chase Picket, [place] 05/13/17 1951  Margarita Grizzleay, Danielle, MD 05/14/17 [phone]

## 2017-05-13 NOTE — Discharge Instructions (Signed)
It was my pleasure taking care of you today! Increase hydration. Please call the cardiology clinic listed to schedule a follow-up appointment for further evaluation. Return to ER for syncopal episode (passing out), new or worsening symptoms, any additional concerns.

## 2017-05-13 NOTE — ED Triage Notes (Signed)
To room via EMS.  Pt went to office to get some supplies and Ibuprofen x 600 mg for headache, drove back to job site and started feeling nauseous and dizzy, got out of truck took several steps and had to lay on ground.  Co-workers told pt he was "in and out of consciousness".  EMS gave ASA 324 mg.  EKG showed runs of PVC's.  Same symptoms happened 1 year ago when dx with PVC's.

## 2017-05-23 ENCOUNTER — Encounter: Payer: Self-pay | Admitting: Cardiology

## 2017-05-23 ENCOUNTER — Ambulatory Visit (INDEPENDENT_AMBULATORY_CARE_PROVIDER_SITE_OTHER): Payer: Self-pay | Admitting: Cardiology

## 2017-05-23 VITALS — BP 128/85 | HR 74 | Ht 76.0 in | Wt 214.4 lb

## 2017-05-23 DIAGNOSIS — R55 Syncope and collapse: Secondary | ICD-10-CM

## 2017-05-23 DIAGNOSIS — I493 Ventricular premature depolarization: Secondary | ICD-10-CM | POA: Insufficient documentation

## 2017-05-23 NOTE — Patient Instructions (Addendum)
NO MEDICATION CHANGES     SCHEDULE AT [address]  Your physician has recommended that you wear an event monitor 30 DAY. Event monitors are medical devices that record the heart's electrical activity. Doctors most often us these monitors to diagnose arrhythmias. Arrhythmias are problems with the speed or rhythm of the heartbeat. The monitor is a small, portable device. You can wear one while you do your normal daily activities. This is usually used to diagnose what is causing palpitations/syncope (passing out).   KEEP HYDRATED  TRY TO AVOID EVENTS TO INCREASE HEART RATE, OR FEEL LIKE YOU WILL PASS OUT    WILL OBTAIN TEST RESULT FROM [facility]- STRESS ECHO     Your physician recommends that you schedule a follow-up appointment in [facility] MON[facility]

## 2017-05-23 NOTE — Progress Notes (Signed)
PCP: Patient, No Pcp Per  Clinic Note: Chief Complaint  Patient presents with  . New Patient (Initial Visit)    Hospital/ER follow-up for chest pain and near syncope    HPI: Jeffrey Ramos is a 27 y.o. male with a PMH below who presents today for evaluation of rapid HR & near syncope. He has had a history of PVCs documented several years ago and had an episode about 1.5 yr ago when he collapsed while doing an exercise for [employer] training - was extremely HOT. During that time, he was evaluated (@ [facility]) & was noted to have ~40-50 PVC/min.  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 He started feeling very dizzy he had sharp chest discomfort and nausea and shortness of breath and had either near syncope or syncope just for a split second  (per pt. Recall - he collapsed & blacked out). They hooked him up to a monitor and noticed that he was having PVCs. He went to the emergency department where routine electrolytes were normal. His potassium was 3.7. I do not have the actual ECG reports. ECG today revealed normal sinus rhythm and a completely normal tracing.   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 He does not drink much caffeine. He thinks he stays well hydrated. He does not sleep well but always feels rested. --> Stress Echo Ordered (can only find the ETT portion - not the Echo).  Apparently these results were reassuring.  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  Was @ work - took a few steps & noted L-sided CP/pressure with Nausea &  Dizziness.  At this point, he sat down on the ground to get his bearings.  His coworkers told him that he was "going in and out of consciousness, but he indicates that he remembers the entire incident" but just could not respond.  It was as though he was having tunnel vision.  Studies Personally Reviewed - (if available, images/films reviewed: From Epic Chart or Care Everywhere)  July 2017 ([facility]):  Exercised 12 min, Max HR of 190 bpm ( 97% MPHR). 14 Mets. + CP during exercise but no ischemic ECG changes.  Frequent Isolated PVCs noted during recovery.   1. Very good exercise tolerance 2. Negative treadmill test for ischemia by ECG criteria 3. Chest pain with exercise and PVCs during recovery.  2D Echo - 09/2016 -- not available to read  Interval History: Jeffrey Ramos presents here today for ER follow-up.  He tells me that he has his history of having very frequent PVCs noted in previous evaluations.  Back in 2017 he had essentially a syncopal episode while at work.  He again was also very hot intensely over exhausted.  This most recent occurrence he describes it is been he was potentially overtired.  He had been up early and working all day but when he finished his day of work, he went to get out to his car and felt really dizzy and nauseated with a headache with significant chest pain.  He says his vision just went gray.  According to EMS, they were unable to feel carotid pulse initially and they were about ready to do CPR.  Apparently someone finally felt a pulse.  He asked the EMS workers how many PVCs he was having and indicated roughly 20-25 PVCs a minute.  Unfortunately those telemetry strips are not available.  He says he has had a couple more episodes not to that extent, but for instance  A few days ago, he was crawling out of the crawl space and was pulling along some heavy equipment and he started feeling heaviness in his chest, short of breath with difficulty breathing and felt dizzy/lightheaded.   He did not actually have a full syncopal episode at that time though.  Apparently back in 2017, he had had several other episodes of near syncope and syncope.  He never did wear a monitor to determine the amount of PVCs he was having however, mostly because he ran out of insurance.  He tells me that he drinks plenty of water he is always drinking water (he says he drinks anywhere from a half to a full gallon of water a day), and does not drink lots of caffeine.  He indicates that he is very sure that he is actually had some to eat while he is drinking.  He does not drink lots of alcohol and was  hung over during this episode.  Other than the spells, he does not have exertional chest tightness or pressure or dyspnea.  No PND orthopnea or edema.  Although he knows he has lots of PVCs, he does not usually feel them.  He cannot really tell if he is having rapid irregular heartbeats irregular heartbeats while he is exercising, and his treadmill test a year and a half ago did not show a worsening PVCs with exertion. No TIA/amaurosis fugax symptoms. No melena, hematochezia, hematuria, or epstaxis. No claudication.  ROS: A comprehensive was performed. Review of Systems  Constitutional: Negative for malaise/fatigue (Ever since the episode he had in early March, he is felt just fatigued).  HENT: Negative for congestion and nosebleeds.   Respiratory: Negative for shortness of breath and wheezing.   Cardiovascular:       Per HPI  Gastrointestinal: Negative for heartburn and nausea.  Genitourinary: Negative for dysuria.       Loss of sex drive  Musculoskeletal: Negative for falls and joint pain.  Neurological: Positive for dizziness (Per HPI).  Endo/Heme/Allergies: Negative for environmental allergies. Does not bruise/bleed easily.  Psychiatric/Behavioral: Negative for memory loss. The patient is nervous/anxious. The patient does not have insomnia.   All other systems reviewed and are negative.   I have  reviewed and (if needed) personally updated the patient's problem list, medications, allergies, past medical and surgical history, social and family history.   Past Medical History:  Diagnosis Date  . Frequent PVCs   . Gastric peptic ulcer   . Syncope, near 09/2015   Normal stress echo; was told he had lots of PVCs    History reviewed. No pertinent surgical history.  No outpatient medications have been marked as taking for the 05/23/17 encounter (Office Visit) with Marykay Lex, MD.    Allergies  Allergen Reactions  . Doxycycline Nausea And Vomiting  . Penicillins Hives    Has patient had a PCN reaction causing immediate rash, facial/tongue/throat swelling, SOB or lightheadedness with hypotension: no Has patient had a PCN reaction causing severe rash involving mucus membranes or skin necrosis:  no Has patient had a PCN reaction that required hospitalization: no Has patient had a PCN reaction occurring within the last 10 years: no If all of the above answers are "NO", then may proceed with Cephalosporin use.     Social History   Tobacco Use  . Smoking status: Former Games developer  . Smokeless tobacco: Never Used  Substance Use Topics  . Alcohol use: Yes    Comment: occassional   . Drug use: No   Social History   Social History Narrative  . Not on file    family history includes Hypertension in his father.  Wt Readings from Last 3 Encounters:  05/23/17 214 lb 6.4 oz (97.3 kg)  09/11/15 210 lb (95.3 kg)  02/10/15 220 lb (99.8 kg)    PHYSICAL EXAM BP 128/85   Pulse 74   Ht 6\' 4"  (1.93 m)   Wt 214 lb 6.4 oz (97.3 kg)   BMI 26.10 kg/m  Physical Exam  Constitutional: He is oriented to person, place, and time. He appears well-developed and well-nourished. No distress.  Healthy-appearing.  Pleasant young man.  Seems to be quite stressed about whether or not he can work with the symptoms.  Neck: No hepatojugular reflux and no JVD present. Carotid bruit is not present.    Cardiovascular: Normal rate, regular rhythm, normal heart sounds and intact distal pulses.  Occasional extrasystoles are present. PMI is not displaced. Exam reveals no gallop and no friction rub.  No murmur heard. Pulmonary/Chest: Effort normal and breath sounds normal. No respiratory distress. He has no wheezes. He has no rales.  Abdominal: Soft. Bowel sounds are normal. He exhibits no distension. There is no tenderness. There is no rebound.  Musculoskeletal: Normal range of motion. He exhibits no edema.  Neurological: He is alert and oriented to person, place, and time. No cranial nerve deficit.  Skin: Skin is warm and dry. No rash noted. No erythema.  Psychiatric: He has a normal mood and affect. His behavior is normal. Judgment and thought content normal.  Vitals reviewed.    Adult ECG Report Not checked  Other studies Reviewed: Additional studies/ records that were reviewed today include:  Recent Labs:     Chemistry      Component Value Date/Time   NA 138 05/13/2017 1708   K 3.8 05/13/2017 1708   CL 104 05/13/2017 1708   CO2 25 05/13/2017 1708   BUN 9 05/13/2017 1708   CREATININE 0.92 05/13/2017 1708      Component Value Date/Time   CALCIUM 9.3 05/13/2017 1708   ALKPHOS 63 11/10/2014 1345   AST 19 11/10/2014 1345   ALT 26 11/10/2014 1345   BILITOT 1.3 (H) 11/10/2014 1345     No results found for: TSH   ASSESSMENT / PLAN: Problem List Items Addressed This Visit    Symptomatic PVCs - Primary (Chronic)    He does seem to have lots of PVCs by his report, but I have not seen any telemetry strips do reveal that.  Unfortunately that is usually the case with the EMS strips. In order to know how to best deal with this situation, we need to have him wear a monitor.  He is already had a stress echo that was negative making ischemic disease less likely.  Apparently his left lites were totally normal as well.  I talked to him about the importance of adequate hydration, he tells  me he drinks about a gallon of water a day.  Unfortunately, I really have  no way of knowing what his symptoms are related to without having some more definitive data.      Relevant Orders   CARDIAC EVENT MONITOR   Near syncope (Chronic)    Hard to tell what was happening.  These episodes seem to be related to stress exhaustion or heat exhaustion and may be exacerbated by PVCs.  Similar symptoms to what he had 2 years ago with a relative normal evaluation.  I need to get the results of that evaluation. I would like to get a monitor on him that he wears for a month and we can see what is going on while he is active at work.  His major issue, is that he does not have insurance and will need to see if his Employer will be willing to help out with the cost.      Relevant Orders   CARDIAC EVENT MONITOR      Current medicines are reviewed at length with the patient today. (+/- concerns) n/a The following changes have been made: n/a  Patient Instructions  NO MEDICATION CHANGES     SCHEDULE AT [address]  Your physician has recommended that you wear an event monitor 30 DAY. Event monitors are medical devices that record the heart's electrical activity. Doctors most often us these monitors to diagnose arrhythmias. Arrhythmias are problems with the speed or rhythm of the heartbeat. The monitor is a small, portable device. You can wear one while you do your normal daily activities. This is usually used to diagnose what is causing palpitations/syncope (passing out).   KEEP HYDRATED  TRY TO AVOID EVENTS TO INCREASE HEART RATE, OR FEEL LIKE YOU WILL PASS OUT    WILL OBTAIN TEST RESULT FROM [facility]- STRESS ECHO     Your physician recommends that you schedule a follow-up appointment in 3 MONTHS WITH DR Lyncoln Ledgerwood    Studies Ordered:   Orders Placed This Encounter  Procedures  . CARDIAC EVENT MONITOR      Bryan Lemmaavid Alberta Cairns, M.D., M.S. Interventional Cardiologist    Pager # [phone] Phone # [phone] [address] [place], [place] [address]   Thank you for choosing [facility]!!

## 2017-05-24 ENCOUNTER — Telehealth: Payer: Self-pay | Admitting: Cardiology

## 2017-05-24 NOTE — Telephone Encounter (Signed)
Routed to the nurse °

## 2017-05-24 NOTE — Telephone Encounter (Signed)
New message    Patient was returning call from Jasmine DecemberSharon about return to work papers

## 2017-05-24 NOTE — Telephone Encounter (Signed)
Spoke to patient  in regards to  Physician release to return to work form.  infmormation was obtain and reviewed with Dr Herbie BaltimoreHarding.  PER Dr Herbie BaltimoreHarding  would like for the patient to do all  Daily and work activity to see what may cause any symptoms to occur. If symptoms occur (ex. racing heart rate , pre -syncopal episodes) to capture on event monitor. Will contact patient to pick  formm

## 2017-05-25 ENCOUNTER — Telehealth: Payer: Self-pay | Admitting: Cardiology

## 2017-05-25 NOTE — Telephone Encounter (Signed)
LATE ENTRY   SPOKE TO PATIENT , -EMAILED  FORM AS REQUESTED BY PATIENT. PATIENT STATE HE RECEIVED FORM

## 2017-05-25 NOTE — Telephone Encounter (Incomplete)
[facility]

## 2017-05-30 ENCOUNTER — Encounter: Payer: Self-pay | Admitting: Cardiology

## 2017-05-30 DIAGNOSIS — R55 Syncope and collapse: Secondary | ICD-10-CM | POA: Insufficient documentation

## 2017-05-30 NOTE — Assessment & Plan Note (Signed)
Hard to tell what was happening.  These episodes seem to be related to stress exhaustion or heat exhaustion and may be exacerbated by PVCs.  Similar symptoms to what he had 2 years ago with a relative normal evaluation.  I need to get the results of that evaluation. I would like to get a monitor on him that he wears for a month and we can see what is going on while he is active at work.  His major issue, is that he does not have insurance and will need to see if his Employer will be willing to help out with the cost.

## 2017-05-30 NOTE — Assessment & Plan Note (Signed)
He does seem to have lots of PVCs by his report, but I have not seen any telemetry strips do reveal that.  Unfortunately that is usually the case with the EMS strips. In order to know how to best deal with this situation, we need to have him wear a monitor.  He is already had a stress echo that was negative making ischemic disease less likely.  Apparently his left lites were totally normal as well.  I talked to him about the importance of adequate hydration, he tells me he drinks about a gallon of water a day.  Unfortunately, I really have no way of knowing what his symptoms are related to without having some more definitive data.

## 2017-08-16 ENCOUNTER — Telehealth: Payer: Self-pay | Admitting: Cardiology

## 2017-08-16 NOTE — Telephone Encounter (Signed)
Received [facility] on 08/16/17, Appt 08/24/17 @ 9:20AM.[place]

## 2017-08-24 ENCOUNTER — Ambulatory Visit: Payer: Self-pay | Admitting: Cardiology

## 2017-08-25 ENCOUNTER — Encounter: Payer: Self-pay | Admitting: *Deleted

## 2017-09-27 ENCOUNTER — Telehealth: Payer: Self-pay | Admitting: Cardiology

## 2017-09-27 ENCOUNTER — Encounter: Payer: Self-pay | Admitting: Cardiology

## 2017-09-27 NOTE — Telephone Encounter (Signed)
New Message:    Pt had a black out spell yesterday.very dizzy nauseate, and vomited last night after work. He needs to be seen and is now ready to wear the 30 day Event Monitor. This morning he has a headache and his entire body is sore.

## 2017-09-27 NOTE — Telephone Encounter (Signed)
Patient called in stated that yesterday at work he was outside in a crawl space with a full body suit and mask. He began to have sharp chest pains, he then began having SOB when he was able to stand up he began coughing and could not catch his breath. Patient stated he felt that his chest was pounding and he then passed out. Patient then came back too and was given water to drink and rested. Around 2 that afternoon he went home and laid down. That evening he began to throw up. He vomited twice and went back to sleep to rest. Patient stated that this morning the only complaints was that he felt sore especially on his left side arm, and had a headache. Patient wanted to know what to do and to see if he can now get a monitor to see what is going on. I advised with Dr.Harding who stated he could see a PA/NP, I spoke with patient who mentioned he was going on vacation and would wait to see Dr.Harding when he returned. Patient was scheduled with Dr.Harding at next available and was advised if this episode happened again to go to ER. Patient was also told to not overheat himself, to take plenty of water breaks and to drink Gatorade to replenish what he lost while sweating. Patient verbalized understanding.

## 2017-10-12 ENCOUNTER — Encounter: Payer: Self-pay | Admitting: Cardiology

## 2017-10-12 ENCOUNTER — Ambulatory Visit (INDEPENDENT_AMBULATORY_CARE_PROVIDER_SITE_OTHER): Payer: Self-pay | Admitting: Cardiology

## 2017-10-12 VITALS — BP 130/79 | HR 74 | Ht 75.5 in | Wt 221.8 lb

## 2017-10-12 DIAGNOSIS — I493 Ventricular premature depolarization: Secondary | ICD-10-CM

## 2017-10-12 DIAGNOSIS — R55 Syncope and collapse: Secondary | ICD-10-CM | POA: Insufficient documentation

## 2017-10-12 NOTE — Patient Instructions (Signed)
Medication Instructions:   NO CHANGE  Testing/Procedures:  Your physician has requested that you have an echocardiogram. Echocardiography is a painless test that uses sound waves to create images of your heart. It provides your doctor with information about the size and shape of your heart and how well your heart's chambers and valves are working. This procedure takes approximately one hour. There are no restrictions for this procedure.   Your physician has recommended that you wear a 30 DAY event monitor. Event monitors are medical devices that record the heart's electrical activity. Doctors most often us these monitors to diagnose arrhythmias. Arrhythmias are problems with the speed or rhythm of the heartbeat. The monitor is a small, portable device. You can wear one while you do your normal daily activities. This is usually used to diagnose what is causing palpitations/syncope (passing out).    Follow-Up:  Your physician recommends that you schedule a follow-up appointment in: 2 MONTHS [facility]   If you need a refill on your cardiac medications before your next appointment, please call your pharmacy.

## 2017-10-12 NOTE — Progress Notes (Signed)
PCP: Patient, No Pcp Per  Clinic Note: Chief Complaint  Patient presents with  . Follow-up    Was unable to have monitor test because of financial issues  . Loss of Consciousness    HPI: Jeffrey Ramos is a 27 y.o. male with a PMH below who presents today for recurrent syncope.  Jeffrey Ramos has is a long-standing history of symptomatic PVCs but has never been with an event monitor.  He was evaluated with a treadmill stress test, (presumably with a stress echo although I do not have the results) that was relatively unremarkable.Jeffrey Ramos was last seen on March 12 for evaluation of syncopal episodes that all seem to be related to being in a very hot environment overheated and being a confined situation.  The initial occasion was several years ago while at the [employer], he had a near syncopal bit after his first experience in the fire house.  This was wearing full turnout gear with oxygen mask etc.  He probably overheated and had a vagal event. - When I saw him he was for an episode on 17 July noting that he was in a crawl space with full body suit mask.  He began having sharp chest pains and shortness of breath when he finally went to stand up he could not catch his breath.  Felt like his chest was pounding and then he passed out.   --> The plan was for him to wear an event monitor to determine if he has any arrhythmias.  Unfortunately, because of financial issues, he was never able to have this done.  Now he does have financial wherewithal to be able to pay for tests.  Recent Hospitalizations: none  Studies Personally Reviewed - (if available, images/films reviewed: From Epic Chart or Care Everywhere)  none   Interval History: Jeffrey Ramos has continued to do relatively well since I last saw him, but just this past month he had a lumbar episode where he was under [place] crawlspace wearing full protective gear and full facemask.  He was there for prolonged period time clearing out forwards  for evaluation.  He again began to feel a sense of overwhelming pressure with heart racing tightness in his chest.  Difficulty breathing.  With the assistance of his partner, he was able to get out from underneath the house.  He did not actually pass out until he tried to stand off when he got out of the crawl space --  basically try to stand up and collapsed.  He was only out for a brief instant, but was extremely flushed and tired/tachycardic.  He called in to the clinic & is now here for re-evaluation.  He says that he needs to be cleared to continue to work.  He enjoys his work & is actually hoping to move into a new role that will not require him to be in the same high heat, confined environment for as long a period of time.  During this last episode, he did feel his heart racing & very short of breath, but no chest pain.   Besides these episodes, he only notes occasional palpitations.    No chest pain or shortness of breath with rest or exertion. No PND, orthopnea or edema. No palpitations, lightheadedness, dizziness, weakness or syncope/near syncope. No TIA/amaurosis fugax symptoms. No melena, hematochezia, hematuria, or epstaxis. No claudication.  ROS: A comprehensive was performed. Review of Systems  Cardiovascular: Positive for palpitations.  Neurological: Positive for dizziness and loss of  consciousness.  Psychiatric/Behavioral: The patient is nervous/anxious.   All other systems reviewed and are negative.  I have reviewed and (if needed) personally updated the patient's problem list, medications, allergies, past medical and surgical history, social and family history.   Past Medical History:  Diagnosis Date  . Frequent PVCs   . Gastric peptic ulcer   . Syncope, near 09/2015   Normal stress echo; was told he had lots of PVCs    History reviewed. No pertinent surgical history.  No outpatient medications have been marked as taking for the 10/12/17 encounter (Office Visit) with  Marykay LexHarding, David W, MD.    Allergies  Allergen Reactions  . Doxycycline Nausea And Vomiting  . Penicillins Hives    Has patient had a PCN reaction causing immediate rash, facial/tongue/throat swelling, SOB or lightheadedness with hypotension: no Has patient had a PCN reaction causing severe rash involving mucus membranes or skin necrosis: no Has patient had a PCN reaction that required hospitalization: no Has patient had a PCN reaction occurring within the last 10 years: no If all of the above answers are "NO", then may proceed with Cephalosporin use.     Social History   Tobacco Use  . Smoking status: Former Games developermoker  . Smokeless tobacco: Never Used  Substance Use Topics  . Alcohol use: Yes    Comment: occassional   . Drug use: No   Social History   Social History Narrative  . Not on file    family history includes Hypertension in his father.  Wt Readings from Last 3 Encounters:  10/12/17 221 lb 12.8 oz (100.6 kg)  05/23/17 214 lb 6.4 oz (97.3 kg)  09/11/15 210 lb (95.3 kg)    PHYSICAL EXAM BP 130/79   Pulse 74   Ht 6' 3.5" (1.918 m)   Wt 221 lb 12.8 oz (100.6 kg)   BMI 27.36 kg/m  Physical Exam  Constitutional: He is oriented to person, place, and time. He appears well-developed and well-nourished. No distress.  Well-groomed.  Healthy-appearing  Neck: Normal range of motion. Neck supple. No hepatojugular reflux and no JVD present. Carotid bruit is not present.  Cardiovascular: Normal rate, regular rhythm, normal heart sounds and intact distal pulses.  Occasional extrasystoles are present. PMI is not displaced. Exam reveals no gallop and no friction rub.  No murmur heard. Pulmonary/Chest: Effort normal and breath sounds normal. No respiratory distress. He has no wheezes. He has no rales.  Abdominal: Soft. Bowel sounds are normal. He exhibits no distension. There is no tenderness. There is no rebound.  Musculoskeletal: Normal range of motion. He exhibits no edema.    Neurological: He is alert and oriented to person, place, and time.  Psychiatric: He has a normal mood and affect. His behavior is normal. Judgment and thought content normal.  Vitals reviewed.    Adult ECG Report N/A  Other studies Reviewed: Additional studies/ records that were reviewed today include:  Recent Labs:  NONE    ASSESSMENT / PLAN: Problem List Items Addressed This Visit    Symptomatic PVCs (Chronic)    He has been shown to have a significant amount of PVCs on evaluation in the past, but has never been shown to have any arrhythmia.  I would like to have her wear monitor for 30 days which would allow me to determine if he actually has anything like a PSVT.  Certainly if he was having SVT in the setting episode that occurred where he passed out, he would be much  more susceptible for having orthostatic syncope exacerbated by the tachycardia. The case then we could potentially treat the beta-blocker to prevent or reduce PVCs and therefore SVT.  We also discussed vagal maneuvers.  Unfortunately it is hard to do a vagal maneuver.  Stay adequately hydrated is the most important point he does seem to be doing a good job with that.      Relevant Orders   Cardiac event monitor   ECHOCARDIOGRAM COMPLETE   Syncope and collapse - Primary    Unfortunately it is hard to tell what the true etiology of his episodes is.  I think this most recent episode much like his previous episodes was all predicated by him being in a confined environment with extreme heat and lack of fresh air.  I think he did not pass out in the crawlspace, he passed out once he went to go stand up.  This would suggest potentially a almost a vasovagal event, exacerbated by tachycardia.    What I do not know is whether or not he is truly having an arrhythmia or if it is simply sinus tachycardia. For now, I think the only way for me to know is going on is for him to wear monitor continue with his current activity level.  I  would also like to check a 2D echocardiogram to exclude any structural abnormality of his heart.  I do not think that there is any ischemia because he did very well with his treadmill stress test couple years ago.  He will continue to stay hydrated, would probably recommend using Gatorade if under extreme heat.  I think if he is has a way to pull his core temperature with ice rest or even ice pack while in a confined situation, that would also be beneficial.  The most effective method would be for him to avoid prolonged periods of time.  If he is able to spend less than 20 minutes at a time and have the ability to get out to get fresh air I think he probably will have any further episodes. Ultimately, if his job will allow him to be in a more supervisory role at this would potentially not be as much of an issue.  If we are able to evaluate with a monitor showing no evidence of arrhythmia, he has developed a normal echocardiogram,  then I think I can safely say that he is safe from cardiac standpoint to continue to work.      Relevant Orders   Cardiac event monitor   ECHOCARDIOGRAM COMPLETE      I spent a total of 45 minutes with the patient and chart review. >  50% of the time was spent in direct patient consultation.   Current medicines are reviewed at length with the patient today.  (+/- concerns) none The following changes have been made:  n/a  Patient Instructions  Medication Instructions:   NO CHANGE  Testing/Procedures:  Your physician has requested that you have an echocardiogram. Echocardiography is a painless test that uses sound waves to create images of your heart. It provides your doctor with information about the size and shape of your heart and how well your heart's chambers and valves are working. This procedure takes approximately one hour. There are no restrictions for this procedure.   Your physician has recommended that you wear a 30 DAY event monitor. Event monitors  are medical devices that record the heart's electrical activity. Doctors most often [place] these monitors to diagnose arrhythmias.  Arrhythmias are problems with the speed or rhythm of the heartbeat. The monitor is a small, portable device. You can wear one while you do your normal daily activities. This is usually used to diagnose what is causing palpitations/syncope (passing out).    Follow-Up:  Your physician recommends that you schedule a follow-up appointment in: 2 MONTHS WITH DR [facility]   If you need a refill on your cardiac medications before your next appointment, please call your pharmacy.     Will dictate a letter for him to take to his employer.  Studies Ordered:   Orders Placed This Encounter  Procedures  . Cardiac event monitor  . ECHOCARDIOGRAM COMPLETE      Bryan Lemma, M.D., M.S. Interventional Cardiologist   Pager # [phone] Phone # [phone] [address] [place], [place] [address]   Thank you for choosing [facility] at [facility]!!

## 2017-10-14 ENCOUNTER — Encounter: Payer: Self-pay | Admitting: Cardiology

## 2017-10-14 NOTE — Assessment & Plan Note (Signed)
Unfortunately it is hard to tell what the true etiology of his episodes is.  I think this most recent episode much like his previous episodes was all predicated by him being in a confined environment with extreme heat and lack of fresh air.  I think he did not pass out in the crawlspace, he passed out once he went to go stand up.  This would suggest potentially a almost a vasovagal event, exacerbated by tachycardia.    What I do not know is whether or not he is truly having an arrhythmia or if it is simply sinus tachycardia. For now, I think the only way for me to know is going on is for him to wear monitor continue with his current activity level.  I would also like to check a 2D echocardiogram to exclude any structural abnormality of his heart.  I do not think that there is any ischemia because he did very well with his treadmill stress test couple years ago.  He will continue to stay hydrated, would probably recommend using Gatorade if under extreme heat.  I think if he is has a way to pull his core temperature with ice rest or even ice pack while in a confined situation, that would also be beneficial.  The most effective method would be for him to avoid prolonged periods of time.  If he is able to spend less than 20 minutes at a time and have the ability to get out to get fresh air I think he probably will have any further episodes. Ultimately, if his job will allow him to be in a more supervisory role at this would potentially not be as much of an issue.  If we are able to evaluate with a monitor showing no evidence of arrhythmia, he has developed a normal echocardiogram,  then I think I can safely say that he is safe from cardiac standpoint to continue to work.

## 2017-10-14 NOTE — Assessment & Plan Note (Signed)
He has been shown to have a significant amount of PVCs on evaluation in the past, but has never been shown to have any arrhythmia.  I would like to have her wear monitor for 30 days which would allow me to determine if he actually has anything like a PSVT.  Certainly if he was having SVT in the setting episode that occurred where he passed out, he would be much more susceptible for having orthostatic syncope exacerbated by the tachycardia. The case then we could potentially treat the beta-blocker to prevent or reduce PVCs and therefore SVT.  We also discussed vagal maneuvers.  Unfortunately it is hard to do a vagal maneuver.  Stay adequately hydrated is the most important point he does seem to be doing a good job with that.

## 2017-10-17 ENCOUNTER — Other Ambulatory Visit: Payer: Self-pay

## 2017-10-17 ENCOUNTER — Telehealth: Payer: Self-pay

## 2017-10-17 ENCOUNTER — Ambulatory Visit (HOSPITAL_COMMUNITY): Payer: Self-pay | Attending: Cardiology

## 2017-10-17 DIAGNOSIS — I088 Other rheumatic multiple valve diseases: Secondary | ICD-10-CM | POA: Insufficient documentation

## 2017-10-17 DIAGNOSIS — I493 Ventricular premature depolarization: Secondary | ICD-10-CM | POA: Insufficient documentation

## 2017-10-17 DIAGNOSIS — R079 Chest pain, unspecified: Secondary | ICD-10-CM | POA: Insufficient documentation

## 2017-10-17 DIAGNOSIS — I429 Cardiomyopathy, unspecified: Secondary | ICD-10-CM | POA: Insufficient documentation

## 2017-10-17 DIAGNOSIS — R55 Syncope and collapse: Secondary | ICD-10-CM | POA: Insufficient documentation

## 2017-10-17 NOTE — Telephone Encounter (Signed)
THANKS FOR Jeffrey Ramos NeedleINFORAMTION

## 2017-10-17 NOTE — Telephone Encounter (Signed)
Received application from patient today with out the W-2 forms.   Called and spoke patient and explain that we will need to send his last W-2 with the application to [employer].  He understood and will drop the information off to the office later this week.   Once we have received the information I will gave to [facility] to enrolled into [employer] and we will fax to then.   [employer] will notify the patient if the are or are not approved for the monitor.

## 2017-11-24 ENCOUNTER — Telehealth: Payer: Self-pay | Admitting: Family Medicine

## 2017-11-24 DIAGNOSIS — R6889 Other general symptoms and signs: Secondary | ICD-10-CM

## 2017-11-24 NOTE — Progress Notes (Signed)
Based on what you shared with me it looks like you have a condition that should be evaluated in a face to face office visit.  NOTE: If you entered your credit card information for this eVisit, you will not be charged. You may see a "hold" on your card for the $30 but that hold will drop off and you will not have a charge processed.  If you are having a true medical emergency please call 911.  If you need an urgent face to face visit, [facility] has four urgent care centers for your convenience.  If you need care fast and have a high deductible or no insurance consider:   [email or web address] to reserve your spot online an avoid wait times  [facility] [address], [address] [place], [place] [address] 8 am to 8 pm Monday-Friday 10 am to 4 pm Saturday-Sunday *Across the street from [employer]  [facility]  [address] [place], [address] 8 am to 5 pm Monday-Friday * In the [facility]   The following sites will take your  insurance:  . [facility]  [phone] Get Driving Directions Find a Provider at this Location  [address] [place], [place] [address] . 10 am to 8 pm Monday-Friday . 12 pm to 8 pm Saturday-Sunday   . [facility]  [phone] Get Driving Directions Find a Provider at this Location  [address], [address] [place], [place] [address] . 8 am to 8 pm Monday-Friday . 9 am to 6 pm Saturday . 11 am to 6 pm Sunday   . [facility]  [phone] Get Driving Directions  [address] [place], [place] [address] . 8 am to 8 pm Monday-Friday . 8 am to 4 pm Saturday-Sunday   Your e-visit answers were reviewed by a board certified advanced clinical practitioner to complete your personal care plan.  Thank you for using e-Visits. 

## 2017-12-18 ENCOUNTER — Ambulatory Visit: Payer: Self-pay | Admitting: Cardiology

## 2018-12-19 ENCOUNTER — Other Ambulatory Visit: Payer: Self-pay | Admitting: Orthopedic Surgery

## 2018-12-19 DIAGNOSIS — M5416 Radiculopathy, lumbar region: Secondary | ICD-10-CM

## 2018-12-26 ENCOUNTER — Ambulatory Visit
Admission: RE | Admit: 2018-12-26 | Discharge: 2018-12-26 | Disposition: A | Discharge status: Home / Self Care | Payer: Self-pay | Source: Ambulatory Visit | Attending: Orthopedic Surgery | Admitting: Orthopedic Surgery

## 2018-12-26 ENCOUNTER — Other Ambulatory Visit: Payer: Self-pay | Admitting: Orthopedic Surgery

## 2018-12-26 ENCOUNTER — Other Ambulatory Visit: Payer: Self-pay

## 2018-12-26 DIAGNOSIS — M5416 Radiculopathy, lumbar region: Secondary | ICD-10-CM

## 2018-12-26 DIAGNOSIS — R52 Pain, unspecified: Secondary | ICD-10-CM

## 2018-12-26 MED ORDER — IOPAMIDOL (ISOVUE-M 200) INJECTION 41%
1.0000 mL | Freq: Once | INTRAMUSCULAR | Status: AC
  Administered 2018-12-26: 1 mL

## 2018-12-26 MED ORDER — VANCOMYCIN HCL 10 G IV SOLR
1500.0000 mg | INTRAVENOUS | Status: AC
  Administered 2018-12-26: 08:00:00 1500 mg via INTRAVENOUS

## 2018-12-26 MED ORDER — SODIUM CHLORIDE 0.9 % IV SOLN
INTRAVENOUS | Status: DC
  Administered 2018-12-26: 08:00:00 via INTRAVENOUS

## 2018-12-26 MED ORDER — METHYLPREDNISOLONE ACETATE 40 MG/ML INJ SUSP (RADIOLOG
120.0000 mg | Freq: Once | INTRAMUSCULAR | Status: AC
  Administered 2018-12-26: 10:00:00 120 mg via INTRALESIONAL

## 2018-12-26 NOTE — Discharge Instructions (Signed)

## 2019-04-03 IMAGING — CR DG CHEST 2V
2 series · 2 of 2 positions shown · non-contrast
Comparison: None.

CLINICAL DATA: Chest pain.

EXAM:
CHEST  2 VIEW

[chest pa]
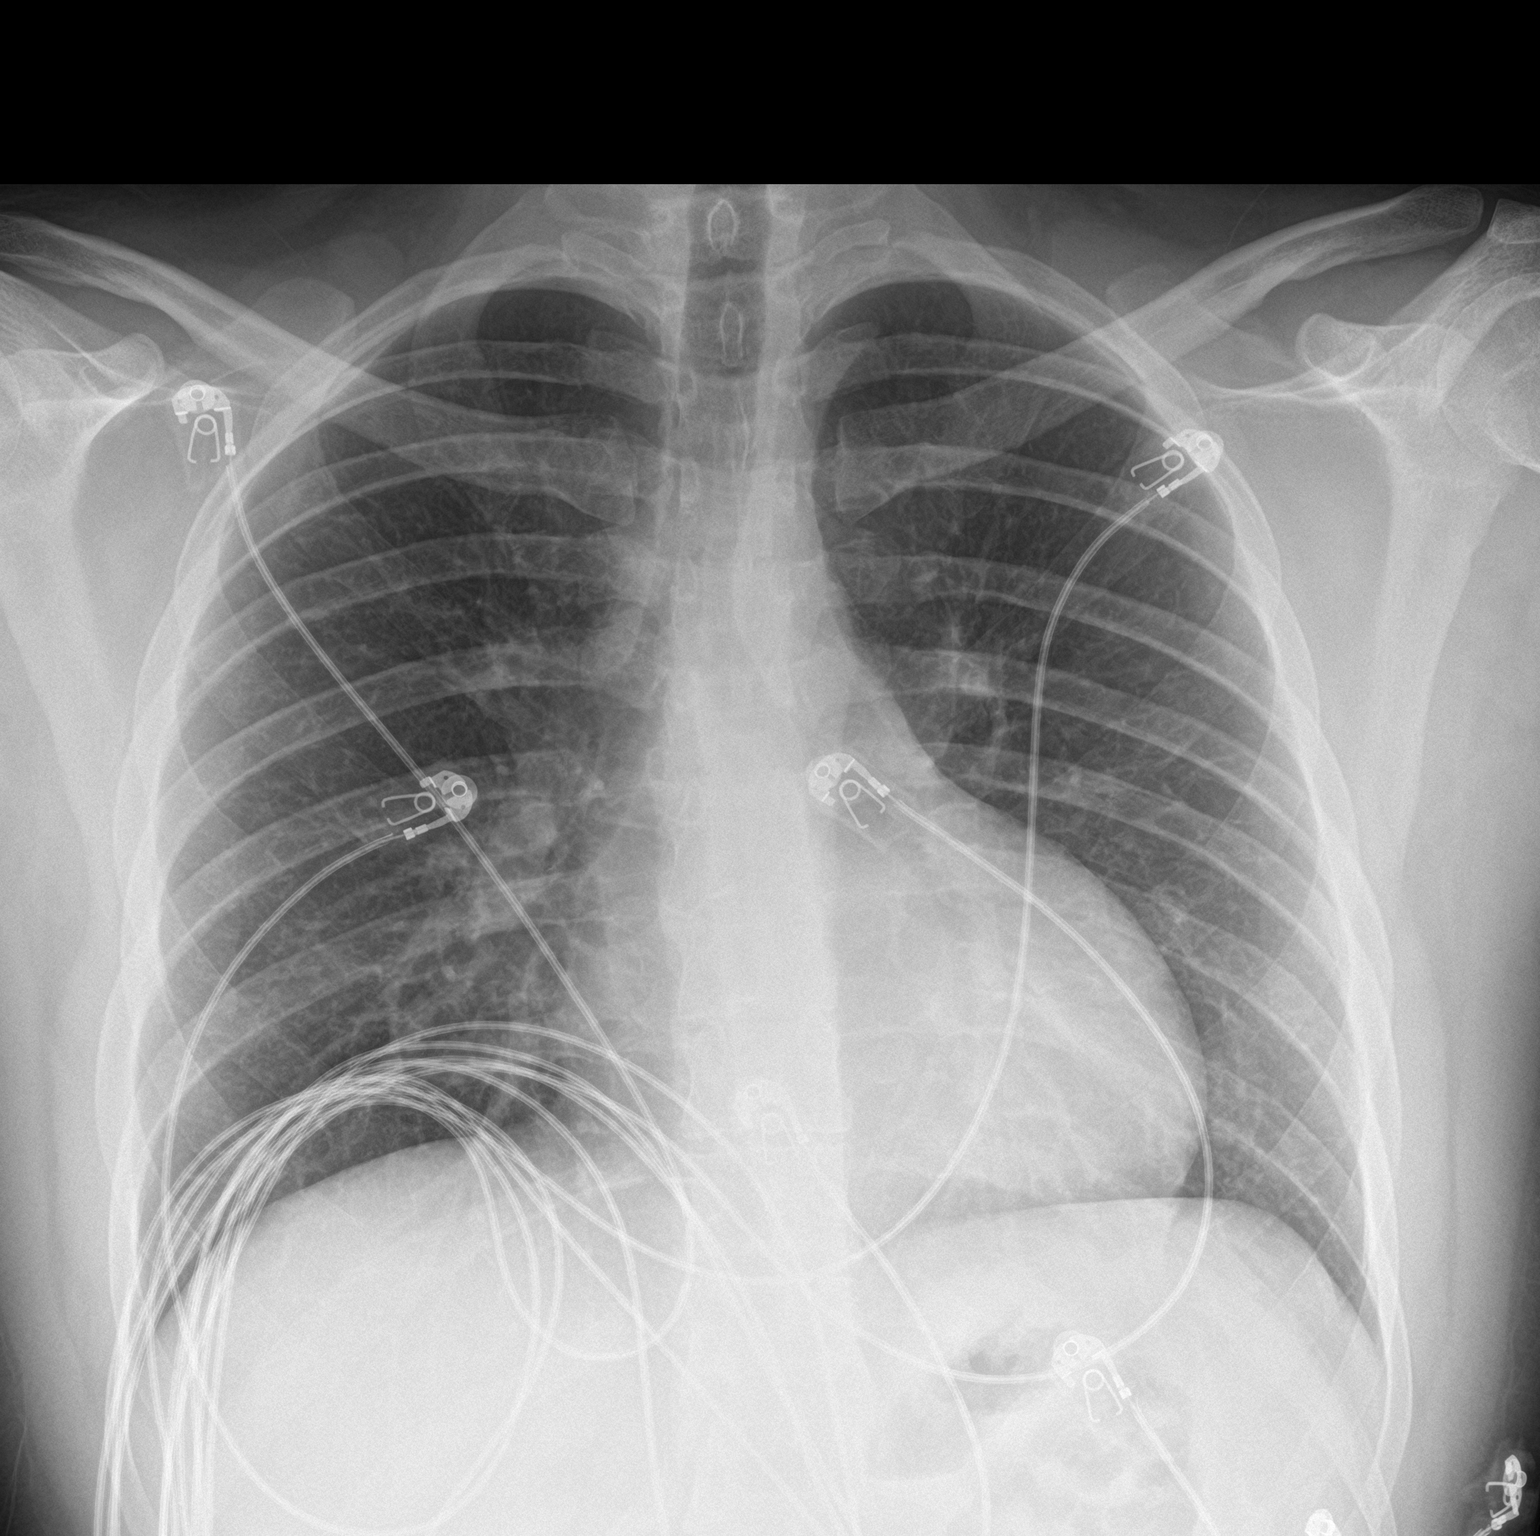

[chest lat]
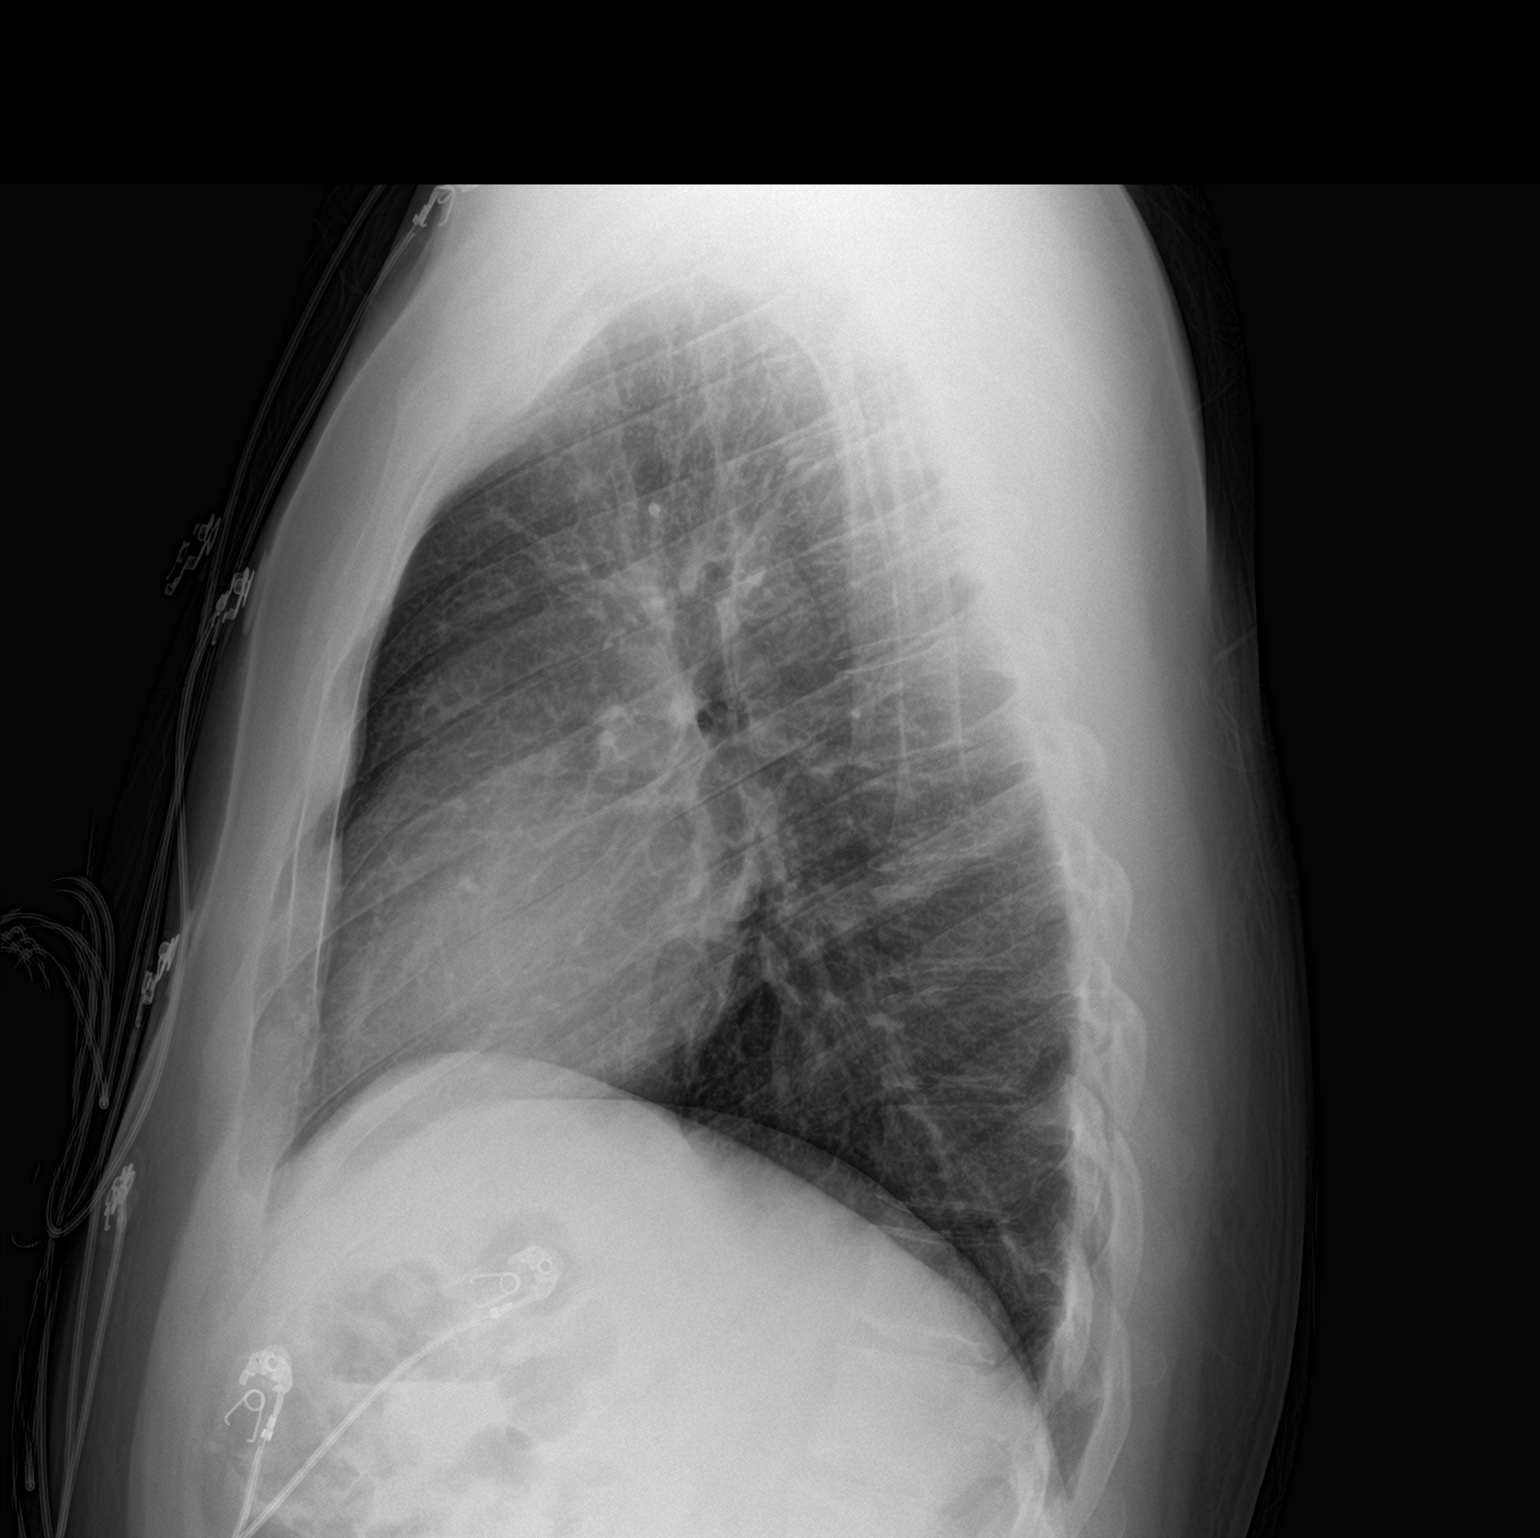

[2 of 2 positions shown; findings below may reference images not displayed]

FINDINGS: The heart size and mediastinal contours are within normal limits.
Both lungs are clear. No pneumothorax or pleural effusion is noted.
The visualized skeletal structures are unremarkable.
IMPRESSION: No active cardiopulmonary disease.
